# Patient Record
Sex: Female | Born: 1990 | Race: Black or African American | Hispanic: No | Marital: Single | Smoking: Never smoker
Health system: Southern US, Community
[De-identification: ages and names within clinical notes are randomized; demographics above are authoritative.]

## PROBLEM LIST (undated history)

## (undated) DIAGNOSIS — B9689 Other specified bacterial agents as the cause of diseases classified elsewhere: Secondary | ICD-10-CM

## (undated) DIAGNOSIS — N760 Acute vaginitis: Secondary | ICD-10-CM

---

## 2009-05-21 ENCOUNTER — Emergency Department (HOSPITAL_COMMUNITY): Admission: EM | Admit: 2009-05-21 | Discharge: 2009-05-21 | Payer: Self-pay | Admitting: Emergency Medicine

## 2011-03-02 ENCOUNTER — Emergency Department (HOSPITAL_COMMUNITY)
Admission: EM | Admit: 2011-03-02 | Discharge: 2011-03-02 | Disposition: A | Discharge status: Home / Self Care | Payer: No Typology Code available for payment source | Attending: Emergency Medicine | Admitting: Emergency Medicine

## 2011-03-02 ENCOUNTER — Emergency Department (HOSPITAL_COMMUNITY): Payer: No Typology Code available for payment source

## 2011-03-02 DIAGNOSIS — M25519 Pain in unspecified shoulder: Secondary | ICD-10-CM | POA: Insufficient documentation

## 2011-03-02 DIAGNOSIS — T1490XA Injury, unspecified, initial encounter: Secondary | ICD-10-CM | POA: Insufficient documentation

## 2011-03-02 DIAGNOSIS — M542 Cervicalgia: Secondary | ICD-10-CM | POA: Insufficient documentation

## 2011-03-02 DIAGNOSIS — Y92410 Unspecified street and highway as the place of occurrence of the external cause: Secondary | ICD-10-CM | POA: Insufficient documentation

## 2011-11-26 DIAGNOSIS — N760 Acute vaginitis: Secondary | ICD-10-CM | POA: Diagnosis not present

## 2011-11-26 DIAGNOSIS — Z01419 Encounter for gynecological examination (general) (routine) without abnormal findings: Secondary | ICD-10-CM | POA: Diagnosis not present

## 2011-11-27 DIAGNOSIS — Z30431 Encounter for routine checking of intrauterine contraceptive device: Secondary | ICD-10-CM | POA: Diagnosis not present

## 2011-12-31 ENCOUNTER — Emergency Department (INDEPENDENT_AMBULATORY_CARE_PROVIDER_SITE_OTHER)
Admission: EM | Admit: 2011-12-31 | Discharge: 2011-12-31 | Disposition: A | Discharge status: Home / Self Care | Payer: PRIVATE HEALTH INSURANCE | Source: Home / Self Care

## 2011-12-31 ENCOUNTER — Encounter (HOSPITAL_COMMUNITY): Payer: Self-pay

## 2011-12-31 DIAGNOSIS — J020 Streptococcal pharyngitis: Secondary | ICD-10-CM

## 2011-12-31 LAB — POCT RAPID STREP A: Streptococcus, Group A Screen (Direct): POSITIVE — AB

## 2011-12-31 MED ORDER — AMOXICILLIN 500 MG PO CAPS: 1000.0000 mg | ORAL_CAPSULE | Freq: Two times a day (BID) | ORAL | Status: AC

## 2011-12-31 NOTE — ED Notes (Signed)
C/o sore throat on rt side since yesterday.

## 2011-12-31 NOTE — ED Provider Notes (Signed)
History     CSN: [id number]  Arrival date & time 12/31/11  1106   First MD Initiated Contact with Patient 12/31/11 1214      Chief Complaint  Patient presents with  . Sore Throat    (Consider location/radiation/quality/duration/timing/severity/associated sxs/prior treatment) Patient is a 21 y.o. female presenting with pharyngitis.  Sore Throat This is a new problem. The current episode started 12 to 24 hours ago. The problem occurs constantly. The problem has been gradually worsening. Pertinent negatives include no chest pain, no abdominal pain, no headaches and no shortness of breath. The symptoms are aggravated by swallowing. Nothing relieves the symptoms. She has tried nothing for the symptoms. The treatment provided no relief.    History reviewed. No pertinent past medical history.  History reviewed. No pertinent past surgical history.  No family history on file.  History  Substance Use Topics  . Smoking status: Never Smoker   . Smokeless tobacco: Not on file  . Alcohol Use: Yes    OB History    Grav Para Term Preterm Abortions TAB SAB Ect Mult Living                  Review of Systems  Constitutional: Positive for fever. Negative for chills, activity change and fatigue.  HENT: Positive for ear pain, sore throat and trouble swallowing. Negative for hearing loss, rhinorrhea, sneezing, neck stiffness and postnasal drip.   Respiratory: Negative.  Negative for shortness of breath.   Cardiovascular: Negative for chest pain.  Gastrointestinal: Negative.  Negative for abdominal pain.  Genitourinary: Negative.   Musculoskeletal: Negative.   Neurological: Negative for headaches.    Allergies  Review of patient's allergies indicates no known allergies.  Home Medications  No current outpatient prescriptions on file.  BP 121/79  Pulse 70  Temp 99.4 F (37.4 C) (Oral)  Resp 16  SpO2 94%  LMP 12/10/2011  Physical Exam  Constitutional: She is oriented to person,  place, and time. She appears well-developed and well-nourished.  HENT:  Left Ear: External ear normal.  Eyes: Pupils are equal, round, and reactive to light.  Neck: Normal range of motion. Neck supple.  Cardiovascular: Normal rate, regular rhythm and normal heart sounds.   Pulmonary/Chest: Effort normal and breath sounds normal.  Musculoskeletal: Normal range of motion.  Neurological: She is alert and oriented to person, place, and time.  Skin: Skin is warm and dry.  Psychiatric: She has a normal mood and affect.    ED Course  Procedures (including critical care time)   Labs Reviewed  POCT RAPID STREP A ([facility] ONLY)   No results found.   No diagnosis found.    MDM  Rapid strep Positive        Hayden Rasmussen, NP 12/31/11 1234

## 2012-01-02 NOTE — ED Provider Notes (Signed)
Medical screening examination/treatment/procedure(s) were performed by resident physician or non-physician practitioner and as supervising physician I was immediately available for consultation/collaboration.   Wyndham Santilli DOUGLAS MD.    Hanan Moen D Clotee Schlicker, MD 01/02/12 2124 

## 2012-01-17 ENCOUNTER — Encounter (HOSPITAL_COMMUNITY): Payer: Self-pay | Admitting: Emergency Medicine

## 2012-01-17 ENCOUNTER — Emergency Department (INDEPENDENT_AMBULATORY_CARE_PROVIDER_SITE_OTHER)
Admission: EM | Admit: 2012-01-17 | Discharge: 2012-01-17 | Disposition: A | Discharge status: Home / Self Care | Payer: PRIVATE HEALTH INSURANCE | Source: Home / Self Care | Attending: Emergency Medicine | Admitting: Emergency Medicine

## 2012-01-17 DIAGNOSIS — N898 Other specified noninflammatory disorders of vagina: Secondary | ICD-10-CM

## 2012-01-17 DIAGNOSIS — N390 Urinary tract infection, site not specified: Secondary | ICD-10-CM

## 2012-01-17 LAB — POCT URINALYSIS DIP (DEVICE)
Bilirubin Urine: NEGATIVE
Nitrite: NEGATIVE
Protein, ur: 30 mg/dL — AB
pH: 6.5 (ref 5.0–8.0)

## 2012-01-17 LAB — RPR: RPR Ser Ql: NONREACTIVE

## 2012-01-17 LAB — WET PREP, GENITAL: Trich, Wet Prep: NONE SEEN

## 2012-01-17 LAB — HIV ANTIBODY (ROUTINE TESTING W REFLEX): HIV: NONREACTIVE

## 2012-01-17 MED ORDER — METRONIDAZOLE 500 MG PO TABS: 500.0000 mg | ORAL_TABLET | Freq: Two times a day (BID) | ORAL | Status: DC

## 2012-01-17 MED ORDER — FLUCONAZOLE 150 MG PO TABS: 150.0000 mg | ORAL_TABLET | Freq: Once | ORAL | Status: DC

## 2012-01-17 MED ORDER — ACYCLOVIR 400 MG PO TABS: 400.0000 mg | ORAL_TABLET | Freq: Three times a day (TID) | ORAL | Status: DC

## 2012-01-17 MED ORDER — CEPHALEXIN 500 MG PO CAPS: 500.0000 mg | ORAL_CAPSULE | Freq: Four times a day (QID) | ORAL | Status: DC

## 2012-01-17 NOTE — ED Notes (Signed)
Pt c/o vaginal discharge with itching x 1 wk. Was seen by school nurse was told that she has swollen lymph nodes and possible vaginal infection.

## 2012-01-17 NOTE — ED Provider Notes (Signed)
Medical screening examination/treatment/procedure(s) were performed by non-physician practitioner and as supervising physician I was immediately available for consultation/collaboration.  Leslee Home, M.D.   Reuben Likes, MD 01/17/12 2230

## 2012-01-17 NOTE — ED Provider Notes (Signed)
History     CSN: [id number]  Arrival date & time 01/17/12  9629   First MD Initiated Contact with Patient 01/17/12 [phone]      Chief Complaint  Patient presents with  . Vaginal Itching    vaginal irritation with discharge    (Consider location/radiation/quality/duration/timing/severity/associated sxs/prior treatment) Patient is a 21 y.o. female presenting with vaginal itching. The history is provided by the patient.  Vaginal Itching   21 y.o. female complains of nonodorous white thin non irritaiting vaginal discharge for 3 days ago. Denies abnormal vaginal bleeding, significant pelvic pain or fever. No UTI symptoms. Sexually active, sometimes condoms, no change in partner.  Last unprotected intercourse 4 days ago.  Denies history of known exposure to STD or symptoms in partner.  Patient's last menstrual period was 12/10/2011.  No history of STD's.  BCM-Implanon.  Last pap August 2013-NIL.  History reviewed. No pertinent past medical history.  History reviewed. No pertinent past surgical history.  History reviewed. No pertinent family history.  History  Substance Use Topics  . Smoking status: Never Smoker   . Smokeless tobacco: Not on file  . Alcohol Use: Yes    OB History    Grav Para Term Preterm Abortions TAB SAB Ect Mult Living                  Review of Systems  Constitutional: Negative.   Respiratory: Negative.   Cardiovascular: Negative.   Gastrointestinal: Negative.   Genitourinary: Positive for vaginal discharge. Negative for dysuria, urgency, flank pain, decreased urine volume, vaginal bleeding, difficulty urinating, menstrual problem and pelvic pain.    Allergies  Review of patient's allergies indicates no known allergies.  Home Medications   Current Outpatient Rx  Name Route Sig Dispense Refill  . ACYCLOVIR 400 MG PO TABS Oral Take 1 tablet (400 mg total) by mouth 3 (three) times daily. For five days, then twice daily. 50 tablet 6  . CEPHALEXIN 500  MG PO CAPS Oral Take 1 capsule (500 mg total) by mouth 4 (four) times daily. 20 capsule 0  . FLUCONAZOLE 150 MG PO TABS Oral Take 1 tablet (150 mg total) by mouth once. May repeat in 4 days if needed. 2 tablet 0  . METRONIDAZOLE 500 MG PO TABS Oral Take 1 tablet (500 mg total) by mouth 2 (two) times daily. 14 tablet 0    BP 106/64  Pulse 81  Temp 99 F (37.2 C) (Oral)  Resp 16  SpO2 99%  LMP 12/10/2011  Physical Exam  Nursing note and vitals reviewed. Constitutional: She is oriented to person, place, and time. Vital signs are normal. She appears well-developed and well-nourished. She is active and cooperative.  HENT:  Head: Normocephalic.  Mouth/Throat: Uvula is midline, oropharynx is clear and moist and mucous membranes are normal.  Eyes: Conjunctivae normal are normal. Pupils are equal, round, and reactive to light. No scleral icterus.  Neck: Trachea normal. Neck supple.  Cardiovascular: Normal rate, regular rhythm, normal heart sounds and normal pulses.   Pulmonary/Chest: Effort normal and breath sounds normal.  Abdominal: Soft. Normal appearance and bowel sounds are normal. There is tenderness in the suprapubic area. There is no CVA tenderness.  Genitourinary: Uterus normal. Pelvic exam was performed with patient supine. No labial fusion. There is lesion on the right labia. There is no rash, tenderness or injury on the right labia. There is lesion on the left labia. There is no rash, tenderness or injury on the left labia. Cervix exhibits  discharge. Cervix exhibits no motion tenderness and no friability. Right adnexum displays no mass, no tenderness and no fullness. Left adnexum displays no mass, no tenderness and no fullness. No erythema, tenderness or bleeding around the vagina. No foreign body around the vagina. No signs of injury around the vagina. Vaginal discharge found.  Lymphadenopathy:       Head (right side): No submental, no submandibular, no tonsillar, no preauricular, no  posterior auricular and no occipital adenopathy present.       Head (left side): No submental, no submandibular, no tonsillar, no preauricular, no posterior auricular and no occipital adenopathy present.    She has no cervical adenopathy.    She has no axillary adenopathy.       Right: Inguinal adenopathy present.       Left: Inguinal adenopathy present.  Neurological: She is alert and oriented to person, place, and time. No cranial nerve deficit or sensory deficit.  Skin: Skin is warm and dry. No rash noted.  Psychiatric: She has a normal mood and affect. Her speech is normal and behavior is normal. Judgment and thought content normal. Cognition and memory are normal.    ED Course  Procedures (including critical care time)  Labs Reviewed  POCT URINALYSIS DIP (DEVICE) - Abnormal; Notable for the following:    Hgb urine dipstick TRACE (*)     Protein, ur 30 (*)     Leukocytes, UA SMALL (*)  Biochemical Testing Only. Please order routine urinalysis from main lab if confirmatory testing is needed.   All other components within normal limits  WET PREP, GENITAL - Abnormal; Notable for the following:    Clue Cells Wet Prep HPF POC MODERATE (*)     WBC, Wet Prep HPF POC MODERATE (*)     All other components within normal limits  POCT PREGNANCY, URINE  GC/CHLAMYDIA PROBE AMP, GENITAL  RPR  HIV ANTIBODY (ROUTINE TESTING)  HSV 1 ANTIBODY, IGG  HSV 2 ANTIBODY, IGG  HERPES SIMPLEX VIRUS CULTURE   No results found.   1. UTI (lower urinary tract infection)   2. Vaginal Discharge   3. Vaginal lesion       MDM  H&P most c/w UTI and BV. Sent off GC/chlamydia, wet prep, HIV, RPR, HSV. Await results.  Antibiotics for UTI and BV. Advised pt to refrain from sexual contact until she he knows lab results, symptoms resolve, and partner(s) are treated. Pt provided working phone number. Pt agrees.          Johnsie Kindred, NP 01/17/12 1103

## 2012-01-18 ENCOUNTER — Telehealth (HOSPITAL_COMMUNITY): Payer: Self-pay | Admitting: *Deleted

## 2012-01-18 LAB — HSV 2 ANTIBODY, IGG: HSV 2 Glycoprotein G Ab, IgG: 0.47 IV

## 2012-01-18 NOTE — ED Notes (Signed)
GC/Chlamydia neg., Wet prep: mod. Clue cells, mod. WBC's, HIV/RPR non-reactive, HSV 1 2.11 (H) pos., HSV 2 0.42 neg., Herpes culture pending. I called pt.  Pt. verified x 2 and given results except herpes culture. Pt. told she would be called next week if it is positive. Pt. told she was adequately treated with Flagyl for bacterial vaginosis.  Pt. instructed to notify her partner. You can pass the virus even when you don't have an outbreak, so always practice safe sex. Get treated for each outbreak with Acyclovir or Valtrex. You may want to get an OB-GYN doctor who can call in a prescription for you when you have an outbreak.  Pt. asked for something for painful sores. I told her I would call back. Vassie Moselle 01/18/2012

## 2012-01-18 NOTE — ED Notes (Signed)
Discussed with Dr. Lorenz Coaster and he ordered Lidocaine gel 4 % apply every 4 hrs 15 gm.  I called pt. back and she said to call it to [facility] on [address]. I called the pharmacist @ [phone] and he only has Lidocaine 5% ointment in a 35 gm tube. Dr. Lorenz Coaster said that was OK. Rx. given to pharmacist. Vassie Moselle 01/18/2012

## 2012-01-21 LAB — HERPES SIMPLEX VIRUS CULTURE

## 2012-01-22 ENCOUNTER — Telehealth (HOSPITAL_COMMUNITY): Payer: Self-pay | Admitting: *Deleted

## 2012-01-22 NOTE — ED Notes (Signed)
Pt called and verified X 2.  Made aware of positive HSV-1 cervical culture.  Instructions previously provided by Cherly Anderson, RN.  Pt advised to establish PCP, number given to [facility].

## 2012-03-28 ENCOUNTER — Other Ambulatory Visit (HOSPITAL_COMMUNITY)
Admission: RE | Admit: 2012-03-28 | Discharge: 2012-03-28 | Disposition: A | Discharge status: Home / Self Care | Payer: PRIVATE HEALTH INSURANCE | Source: Ambulatory Visit | Attending: Family Medicine | Admitting: Family Medicine

## 2012-03-28 ENCOUNTER — Encounter (HOSPITAL_COMMUNITY): Payer: Self-pay | Admitting: Emergency Medicine

## 2012-03-28 ENCOUNTER — Emergency Department (INDEPENDENT_AMBULATORY_CARE_PROVIDER_SITE_OTHER)
Admission: EM | Admit: 2012-03-28 | Discharge: 2012-03-28 | Disposition: A | Discharge status: Home / Self Care | Payer: PRIVATE HEALTH INSURANCE | Source: Home / Self Care | Attending: Family Medicine | Admitting: Family Medicine

## 2012-03-28 DIAGNOSIS — N644 Mastodynia: Secondary | ICD-10-CM

## 2012-03-28 DIAGNOSIS — N760 Acute vaginitis: Secondary | ICD-10-CM | POA: Insufficient documentation

## 2012-03-28 DIAGNOSIS — N898 Other specified noninflammatory disorders of vagina: Secondary | ICD-10-CM

## 2012-03-28 LAB — HIV ANTIBODY (ROUTINE TESTING W REFLEX): HIV: NONREACTIVE

## 2012-03-28 MED ORDER — FLUCONAZOLE 150 MG PO TABS: 150.0000 mg | ORAL_TABLET | Freq: Once | ORAL | Status: DC

## 2012-03-28 MED ORDER — METRONIDAZOLE 500 MG PO TABS: 500.0000 mg | ORAL_TABLET | Freq: Two times a day (BID) | ORAL | Status: DC

## 2012-03-28 NOTE — ED Notes (Signed)
Pt c/o abd pain x2 weeks... Denies: fevers, vomiting, nauseas, diarrhea, urinary prob, vaginal discharge.... Normal bowel movements... Also c/o bilateral breast pain on the sides x2 weeks... Denies: any lumps/mass... Pt is alert w/no signs of distress.

## 2012-03-28 NOTE — ED Provider Notes (Signed)
History     CSN: [id number]  Arrival date & time 03/28/12  0901   None     Chief Complaint  Patient presents with  . Abdominal Pain    (Consider location/radiation/quality/duration/timing/severity/associated sxs/prior treatment) Patient is a 21 y.o. female presenting with vaginal discharge. The history is provided by the patient.  Vaginal Discharge This is a new problem. The current episode started more than 2 days ago. The problem occurs daily. The problem has not changed since onset.Associated symptoms include chest pain.  21 y.o. female complains of white itchy vaginal discharge for 1 week.  Denies abnormal vaginal bleeding, significant pelvic pain or fever. No UTI symptoms. Sexually active, does not use condoms, no change in partner.  Last unprotected intercourse 3 months ago.  Denies history of known exposure to STD or symptoms in partner.  No LMP recorded. Patient has had an implant.  Known history of genital herpes.  Pt additionally complains of bilateral breast pain for 1 week, tender to palpation.    Past Medical History  Diagnosis Date  . Hepatitis C     History reviewed. No pertinent past surgical history.  No family history on file.  History  Substance Use Topics  . Smoking status: Never Smoker   . Smokeless tobacco: Not on file  . Alcohol Use: Yes    OB History    Grav Para Term Preterm Abortions TAB SAB Ect Mult Living                  Review of Systems  Cardiovascular: Positive for chest pain.  Genitourinary: Positive for vaginal discharge.  All other systems reviewed and are negative.    Allergies  Review of patient's allergies indicates no known allergies.  Home Medications   Current Outpatient Rx  Name  Route  Sig  Dispense  Refill  . ACYCLOVIR 400 MG PO TABS   Oral   Take 1 tablet (400 mg total) by mouth 3 (three) times daily. For five days, then twice daily.   50 tablet   6   . FLUCONAZOLE 150 MG PO TABS   Oral   Take 1 tablet  (150 mg total) by mouth once. May repeat in 4 days if needed.   2 tablet   0   . METRONIDAZOLE 500 MG PO TABS   Oral   Take 1 tablet (500 mg total) by mouth 2 (two) times daily.   14 tablet   0     BP 108/69  Pulse 82  Temp 98.6 F (37 C) (Oral)  Resp 20  SpO2 100%  Physical Exam  Nursing note and vitals reviewed. Constitutional: She is oriented to person, place, and time. Vital signs are normal. She appears well-developed and well-nourished. She is active and cooperative.  HENT:  Head: Normocephalic.  Eyes: Conjunctivae normal are normal. Pupils are equal, round, and reactive to light. No scleral icterus.  Neck: Trachea normal and normal range of motion. Neck supple.  Cardiovascular: Normal rate, regular rhythm, normal heart sounds and intact distal pulses.   Pulmonary/Chest: Effort normal and breath sounds normal. Right breast exhibits tenderness. Right breast exhibits no inverted nipple, no mass, no nipple discharge and no skin change. Left breast exhibits tenderness. Left breast exhibits no inverted nipple, no mass, no nipple discharge and no skin change. Breasts are symmetrical.  Abdominal: Soft. Normal appearance. There is no tenderness. There is no CVA tenderness.  Genitourinary: Pelvic exam was performed with patient supine. No labial fusion. There is  lesion on the right labia. There is no rash, tenderness or injury on the right labia. There is no rash, tenderness, lesion or injury on the left labia. No erythema, tenderness or bleeding around the vagina. No foreign body around the vagina. No signs of injury around the vagina. Vaginal discharge found.  Lymphadenopathy:    She has no cervical adenopathy.       Right: No inguinal adenopathy present.       Left: No inguinal adenopathy present.  Neurological: She is alert and oriented to person, place, and time. No cranial nerve deficit or sensory deficit.  Skin: Skin is warm and dry.  Psychiatric: She has a normal mood and  affect. Her speech is normal and behavior is normal. Judgment and thought content normal. Cognition and memory are normal.    ED Course  Procedures (including critical care time)   Labs Reviewed  CERVICOVAGINAL ANCILLARY ONLY  HIV ANTIBODY (ROUTINE TESTING)   No results found.   1. Vaginal discharge   2. Breast pain       MDM  Await cultures and HIV results, begin medications as prescribed.  [facility].  Breast tenderness more than likely related to hormonal changes-no mass palpated.        Johnsie Kindred, NP 03/28/12 [phone]

## 2012-03-31 NOTE — ED Notes (Signed)
HIV antibody final report : negative ; cervical repotr pending

## 2012-03-31 NOTE — ED Provider Notes (Signed)
Medical screening examination/treatment/procedure(s) were performed by resident physician or non-physician practitioner and as supervising physician I was immediately available for consultation/collaboration.   Barkley Bruns MD.    Linna Hoff, MD 03/31/12 1723

## 2012-04-01 NOTE — ED Notes (Signed)
Candida positive, Gardnerella neg., trich neg.  Pt. Adequately treated with Diflucan. Vassie Moselle 04/01/2012

## 2012-05-21 ENCOUNTER — Encounter (HOSPITAL_COMMUNITY): Payer: Self-pay | Admitting: *Deleted

## 2012-05-21 ENCOUNTER — Emergency Department (INDEPENDENT_AMBULATORY_CARE_PROVIDER_SITE_OTHER)
Admission: EM | Admit: 2012-05-21 | Discharge: 2012-05-21 | Disposition: A | Discharge status: Home / Self Care | Payer: PRIVATE HEALTH INSURANCE | Source: Home / Self Care | Attending: Family Medicine | Admitting: Family Medicine

## 2012-05-21 DIAGNOSIS — N644 Mastodynia: Secondary | ICD-10-CM

## 2012-05-21 DIAGNOSIS — N898 Other specified noninflammatory disorders of vagina: Secondary | ICD-10-CM

## 2012-05-21 DIAGNOSIS — N390 Urinary tract infection, site not specified: Secondary | ICD-10-CM

## 2012-05-21 LAB — POCT URINALYSIS DIP (DEVICE)
Bilirubin Urine: NEGATIVE
Ketones, ur: NEGATIVE mg/dL
Leukocytes, UA: NEGATIVE
Specific Gravity, Urine: >=1.03 (ref 1.005–1.030)

## 2012-05-21 LAB — POCT PREGNANCY, URINE: Preg Test, Ur: NEGATIVE

## 2012-05-21 MED ORDER — CIPROFLOXACIN HCL 500 MG PO TABS: 500.0000 mg | ORAL_TABLET | Freq: Two times a day (BID) | ORAL | Status: DC

## 2012-05-21 NOTE — ED Notes (Addendum)
C/o low abdominal pain onset 2 weeks ago and low back pain onset 2 days ago.  Pain in back is sharp.  Voiding frequently in small amounts.  No burning with urination.  Urine is yellow despite drinking a lot of water. Urine sometimes has an odor.

## 2012-05-21 NOTE — ED Provider Notes (Signed)
History     CSN: [id number]  Arrival date & time 05/21/12  1918   First MD Initiated Contact with Patient 05/21/12 1930      Chief Complaint  Patient presents with  . Urinary Tract Infection    (Consider location/radiation/quality/duration/timing/severity/associated sxs/prior treatment) Patient is a 22 y.o. female presenting with urinary tract infection. The history is provided by the patient.  Urinary Tract Infection This is a new problem. The current episode started more than 2 days ago. The problem has been gradually worsening. Pertinent negatives include no abdominal pain.    Past Medical History  Diagnosis Date  . Hepatitis C     History reviewed. No pertinent past surgical history.  History reviewed. No pertinent family history.  History  Substance Use Topics  . Smoking status: Never Smoker   . Smokeless tobacco: Not on file  . Alcohol Use: Yes     Comment: occasional    OB History    Grav Para Term Preterm Abortions TAB SAB Ect Mult Living                  Review of Systems  Constitutional: Negative.   Gastrointestinal: Negative.  Negative for abdominal pain.  Genitourinary: Positive for urgency and frequency. Negative for hematuria, vaginal bleeding, vaginal discharge, vaginal pain and menstrual problem.    Allergies  Review of patient's allergies indicates no known allergies.  Home Medications   Current Outpatient Rx  Name  Route  Sig  Dispense  Refill  . ACYCLOVIR 400 MG PO TABS   Oral   Take 1 tablet (400 mg total) by mouth 3 (three) times daily. For five days, then twice daily.   50 tablet   6   . CIPROFLOXACIN HCL 500 MG PO TABS   Oral   Take 1 tablet (500 mg total) by mouth every 12 (twelve) hours.   10 tablet   0   . FLUCONAZOLE 150 MG PO TABS   Oral   Take 1 tablet (150 mg total) by mouth once. May repeat in 4 days if needed.   2 tablet   0   . METRONIDAZOLE 500 MG PO TABS   Oral   Take 1 tablet (500 mg total) by mouth 2  (two) times daily.   14 tablet   0     BP 106/65  Pulse 66  Temp 98.5 F (36.9 C) (Oral)  Resp 16  Ht 5\' 4"  (1.626 m)  Wt 125 lb (56.7 kg)  BMI 21.46 kg/m2  SpO2 100%  Physical Exam  Nursing note and vitals reviewed. Constitutional: She is oriented to person, place, and time. She appears well-developed and well-nourished.  Abdominal: Soft. Bowel sounds are normal. She exhibits no mass. There is no tenderness. There is no rebound and no guarding.  Neurological: She is alert and oriented to person, place, and time.  Skin: Skin is warm and dry.    ED Course  Procedures (including critical care time)   Labs Reviewed  POCT URINALYSIS DIP (DEVICE)  POCT PREGNANCY, URINE   No results found.   1. UTI (lower urinary tract infection)       MDM          Linna Hoff, MD 05/21/12 [phone]

## 2012-07-21 ENCOUNTER — Encounter (HOSPITAL_COMMUNITY): Payer: Self-pay | Admitting: Emergency Medicine

## 2012-07-21 ENCOUNTER — Other Ambulatory Visit (HOSPITAL_COMMUNITY)
Admission: RE | Admit: 2012-07-21 | Discharge: 2012-07-21 | Disposition: A | Discharge status: Home / Self Care | Payer: PRIVATE HEALTH INSURANCE | Source: Ambulatory Visit | Attending: Emergency Medicine | Admitting: Emergency Medicine

## 2012-07-21 ENCOUNTER — Emergency Department (INDEPENDENT_AMBULATORY_CARE_PROVIDER_SITE_OTHER)
Admission: EM | Admit: 2012-07-21 | Discharge: 2012-07-21 | Disposition: A | Discharge status: Home / Self Care | Payer: PRIVATE HEALTH INSURANCE | Source: Home / Self Care | Attending: Emergency Medicine | Admitting: Emergency Medicine

## 2012-07-21 DIAGNOSIS — R3989 Other symptoms and signs involving the genitourinary system: Secondary | ICD-10-CM

## 2012-07-21 DIAGNOSIS — Z113 Encounter for screening for infections with a predominantly sexual mode of transmission: Secondary | ICD-10-CM | POA: Insufficient documentation

## 2012-07-21 DIAGNOSIS — R399 Unspecified symptoms and signs involving the genitourinary system: Secondary | ICD-10-CM

## 2012-07-21 DIAGNOSIS — N760 Acute vaginitis: Secondary | ICD-10-CM | POA: Insufficient documentation

## 2012-07-21 LAB — POCT I-STAT, CHEM 8
BUN: 6 mg/dL (ref 6–23)
Chloride: 103 mEq/L (ref 96–112)
Creatinine, Ser: 0.8 mg/dL (ref 0.50–1.10)
Glucose, Bld: 96 mg/dL (ref 70–99)
Hemoglobin: 16 g/dL — ABNORMAL HIGH (ref 12.0–15.0)
Potassium: 4 mEq/L (ref 3.5–5.1)
Sodium: 140 mEq/L (ref 135–145)

## 2012-07-21 LAB — POCT URINALYSIS DIP (DEVICE)
Glucose, UA: NEGATIVE mg/dL
Hgb urine dipstick: NEGATIVE
Nitrite: NEGATIVE
Protein, ur: NEGATIVE mg/dL
Urobilinogen, UA: 0.2 mg/dL (ref 0.0–1.0)

## 2012-07-21 LAB — CBC WITH DIFFERENTIAL/PLATELET
Basophils Absolute: 0.1 10*3/uL (ref 0.0–0.1)
Basophils Relative: 1 % (ref 0–1)
Eosinophils Relative: 4 % (ref 0–5)
Lymphocytes Relative: 23 % (ref 12–46)
MCHC: 34.8 g/dL (ref 30.0–36.0)
MCV: 88.7 fL (ref 78.0–100.0)
Platelets: 321 10*3/uL (ref 150–400)
RDW: 12.7 % (ref 11.5–15.5)
WBC: 8.2 10*3/uL (ref 4.0–10.5)

## 2012-07-21 LAB — POCT PREGNANCY, URINE: Preg Test, Ur: NEGATIVE

## 2012-07-21 NOTE — ED Notes (Signed)
Pt c/o Abdominal pain and back pain x 2 days. Possible UTI sx. Urinary frequency and burning. No discharge. No fever, n/v, or diarrhea. LMP 2 days ago. Patient is alert and oriented.

## 2012-07-21 NOTE — ED Provider Notes (Signed)
History     CSN: [id number]  Arrival date & time 07/21/12  1020   First MD Initiated Contact with Patient 07/21/12 1021      Chief Complaint  Patient presents with  . Abdominal Pain  . Back Pain    (Consider location/radiation/quality/duration/timing/severity/associated sxs/prior treatment) HPI Comments: Patient presents urgent care describing that for a few weeks intermittently she's been having recurrent lower back pains associated with the sensation that when she finished urinating " feels like I want to keep urinating but I can't move", it's not all the time but I feel that once in a while. Patient denies any vaginal discharge bleeding rashes and expresses no concerns as having been exposed to a sexual transmitted illness. Patient denies any pelvic pain fevers or flank pain nausea or vomiting. She has not tried anything for her lower back pain  Patient is a 22 y.o. female presenting with abdominal pain and back pain. The history is provided by the patient.  Abdominal Pain Pain location:  Suprapubic Pain quality: aching and dull   Pain radiates to:  Back Pain severity:  Mild Onset quality:  Gradual Timing:  Intermittent Chronicity:  Recurrent Context: not recent sexual activity   Relieved by:  Nothing Associated symptoms: no anorexia, no chills, no diarrhea, no dysuria, no fever, no hematuria, no nausea, no shortness of breath, no vaginal bleeding, no vaginal discharge and no vomiting   Risk factors: not pregnant   Back Pain Associated symptoms: abdominal pain   Associated symptoms: no dysuria, no fever and no pelvic pain     Past Medical History  Diagnosis Date  . Hepatitis C     History reviewed. No pertinent past surgical history.  History reviewed. No pertinent family history.  History  Substance Use Topics  . Smoking status: Never Smoker   . Smokeless tobacco: Not on file  . Alcohol Use: Yes     Comment: occasional    OB History   Grav Para Term Preterm  Abortions TAB SAB Ect Mult Living                  Review of Systems  Constitutional: Negative for fever, chills, activity change and appetite change.  HENT: Negative for neck pain and neck stiffness.   Respiratory: Negative for shortness of breath.   Gastrointestinal: Positive for abdominal pain. Negative for nausea, vomiting, diarrhea and anorexia.  Genitourinary: Positive for difficulty urinating. Negative for dysuria, urgency, frequency, hematuria, flank pain, decreased urine volume, vaginal bleeding, vaginal discharge, enuresis, genital sores, menstrual problem, pelvic pain and dyspareunia.  Musculoskeletal: Positive for back pain. Negative for joint swelling, arthralgias and gait problem.  Skin: Negative for rash.    Allergies  Review of patient's allergies indicates no known allergies.  Home Medications   Current Outpatient Rx  Name  Route  Sig  Dispense  Refill  . fish oil-omega-3 fatty acids 1000 MG capsule   Oral   Take 2 g by mouth daily.         Marland Kitchen acyclovir (ZOVIRAX) 400 MG tablet   Oral   Take 1 tablet (400 mg total) by mouth 3 (three) times daily. For five days, then twice daily.   50 tablet   6   . ciprofloxacin (CIPRO) 500 MG tablet   Oral   Take 1 tablet (500 mg total) by mouth every 12 (twelve) hours.   10 tablet   0   . fluconazole (DIFLUCAN) 150 MG tablet   Oral   Take  1 tablet (150 mg total) by mouth once. May repeat in 4 days if needed.   2 tablet   0   . metroNIDAZOLE (FLAGYL) 500 MG tablet   Oral   Take 1 tablet (500 mg total) by mouth 2 (two) times daily.   14 tablet   0     BP 113/80  Pulse 60  Temp(Src) 99.2 F (37.3 C) (Oral)  SpO2 100%  LMP 07/19/2012  Physical Exam  Nursing note and vitals reviewed. Constitutional: She appears well-developed and well-nourished.  Cardiovascular: Normal rate.  Exam reveals no gallop and no friction rub.   No murmur heard. Abdominal: Soft. She exhibits no distension and no mass. There is no  tenderness. There is no rigidity, no rebound, no guarding, no CVA tenderness, no tenderness at McBurney's point and negative Murphy's sign.  Neurological: She is alert.  Skin: No erythema.    ED Course  Procedures (including critical care time)  Labs Reviewed  POCT I-STAT, CHEM 8 - Abnormal; Notable for the following:    Calcium, Ion 1.29 (*)    Hemoglobin 16.0 (*)    HCT 47.0 (*)    All other components within normal limits  URINE CULTURE  CBC WITH DIFFERENTIAL  POCT URINALYSIS DIP (DEVICE)  POCT PREGNANCY, URINE  URINE CYTOLOGY ANCILLARY ONLY   No results found.   1. Lower urinary tract symptoms       MDM  Nonspecific- recurrent in term attempt urinary symptoms. Abdominal exam was unremarkable urine today was are markable absent sample for cultures. Have encouraged patient to followup with the urologist her primary care Dr. for symptoms persist. We have also send sample for a STD screening.        Jimmie Molly, MD 07/21/12 [phone]

## 2012-07-21 NOTE — ED Notes (Signed)
Patient not ready for discharge.  Having labs and urine specimens collected.

## 2012-07-22 LAB — URINE CULTURE: Colony Count: NO GROWTH

## 2012-08-29 ENCOUNTER — Other Ambulatory Visit (HOSPITAL_COMMUNITY)
Admission: RE | Admit: 2012-08-29 | Discharge: 2012-08-29 | Disposition: A | Discharge status: Home / Self Care | Payer: Medicaid - Out of State | Source: Ambulatory Visit | Attending: Emergency Medicine | Admitting: Emergency Medicine

## 2012-08-29 ENCOUNTER — Encounter (HOSPITAL_COMMUNITY): Payer: Self-pay | Admitting: Emergency Medicine

## 2012-08-29 ENCOUNTER — Emergency Department (INDEPENDENT_AMBULATORY_CARE_PROVIDER_SITE_OTHER)
Admission: EM | Admit: 2012-08-29 | Discharge: 2012-08-29 | Disposition: A | Discharge status: Home / Self Care | Payer: PRIVATE HEALTH INSURANCE | Source: Home / Self Care | Attending: Emergency Medicine | Admitting: Emergency Medicine

## 2012-08-29 DIAGNOSIS — A499 Bacterial infection, unspecified: Secondary | ICD-10-CM

## 2012-08-29 DIAGNOSIS — Z113 Encounter for screening for infections with a predominantly sexual mode of transmission: Secondary | ICD-10-CM | POA: Insufficient documentation

## 2012-08-29 DIAGNOSIS — R3989 Other symptoms and signs involving the genitourinary system: Secondary | ICD-10-CM

## 2012-08-29 DIAGNOSIS — N760 Acute vaginitis: Secondary | ICD-10-CM

## 2012-08-29 LAB — POCT URINALYSIS DIP (DEVICE)
Bilirubin Urine: NEGATIVE
Hgb urine dipstick: NEGATIVE
Ketones, ur: NEGATIVE mg/dL
Protein, ur: NEGATIVE mg/dL
Specific Gravity, Urine: >=1.03 (ref 1.005–1.030)
pH: 5.5 (ref 5.0–8.0)

## 2012-08-29 MED ORDER — METRONIDAZOLE 500 MG PO TABS: 500.0000 mg | ORAL_TABLET | Freq: Two times a day (BID) | ORAL | Status: DC

## 2012-08-29 NOTE — ED Notes (Signed)
Chart review.

## 2012-08-29 NOTE — ED Provider Notes (Addendum)
Chief Complaint:   Chief Complaint  Patient presents with  . Vaginal Discharge    History of Present Illness:   Jennifer Strong is a 22 year old female who has had a one-week history of vaginal discharge and older. She has a history of yeast infections in the past. She has an Implanad and, but has had spotting bleeding for the past one month. She denies any vaginal itching, pain in the vulva, vagina, or pelvic area, fever, chills, nausea, vomiting, or urinary symptoms.  Review of Systems:  Other than noted above, the patient denies any of the following symptoms: Systemic:  No fever, chills, sweats, or weight loss. GI:  No abdominal pain, nausea, anorexia, vomiting, diarrhea, constipation, melena or hematochezia. GU:  No dysuria, frequency, urgency, hematuria, vaginal discharge, itching, or abnormal vaginal bleeding. Skin:  No rash or itching.  PMFSH:  Past medical history, family history, social history, meds, and allergies were reviewed.    Physical Exam:   Vital signs:  BP 120/74  Pulse 92  Temp(Src) 98.5 F (36.9 C) (Oral)  Resp 18  SpO2 100%  LMP 08/29/2012 General:  Alert, oriented and in no distress. Lungs:  Breath sounds clear and equal bilaterally.  No wheezes, rales or rhonchi. Heart:  Regular rhythm.  No gallops or murmers. Abdomen:  Soft, flat and non-distended.  No organomegaly or mass.  No tenderness, guarding or rebound.  Bowel sounds normally active. Pelvic exam:  Normal external genitalia, vaginal and cervical mucosa were normal. There was no discharge and only minimal odor. There was a small amount of bleeding coming from the cervical os. There was no pain on movement of the cervical os. The uterus was normal in size and shape and nontender. She has mild adnexal tenderness bilaterally. There were no masses. Skin:  Clear, warm and dry.  Labs:   Results for orders placed during the hospital encounter of 08/29/12  POCT URINALYSIS DIP (DEVICE)      Result Value  Range   Glucose, UA NEGATIVE  NEGATIVE mg/dL   Bilirubin Urine NEGATIVE  NEGATIVE   Ketones, ur NEGATIVE  NEGATIVE mg/dL   Specific Gravity, Urine >=1.030  1.005 - 1.030   Hgb urine dipstick NEGATIVE  NEGATIVE   pH 5.5  5.0 - 8.0   Protein, ur NEGATIVE  NEGATIVE mg/dL   Urobilinogen, UA 0.2  0.0 - 1.0 mg/dL   Nitrite NEGATIVE  NEGATIVE   Leukocytes, UA NEGATIVE  NEGATIVE  POCT PREGNANCY, URINE      Result Value Range   Preg Test, Ur NEGATIVE  NEGATIVE    Assessment:  The encounter diagnosis was Bacterial vaginosis.  Patient desires removal of Implanad. This may be contributing to her bacterial vaginosis.  Plan:   1.  The following meds were prescribed:   Discharge Medication List as of 08/29/2012  2:41 PM    START taking these medications   Details  !! metroNIDAZOLE (FLAGYL) 500 MG tablet Take 1 tablet (500 mg total) by mouth 2 (two) times daily., Starting 08/29/2012, Until Discontinued, Normal     !! - Potential duplicate medications found. Please discuss with provider.     2.  The patient was instructed in symptomatic care and handouts were given. 3.  The patient was told to return if becoming worse in any way, if no better in 3 or 4 days, and given some red flag symptoms such as worsening pelvic pain, fever, or vomiting that would indicate earlier return. 4.  Follow up with the family  practice at [facility] hospital clinics for removal of Implanad. She also has had chronic, irritative voiding symptoms. She was here a month and a half ago because of these symptoms. Her he urine culture came back negative. She was given antibiotics, but has not improved. I suggested followup with Dr. Alfredo Martinez.    Reuben Likes, MD 08/29/12 1610  Reuben Likes, MD 08/29/12 2206

## 2012-08-29 NOTE — ED Notes (Signed)
Pt c/o vag discharge onset 1 week Discharge is white w/an odor to it Denies: hematuria, dysuria, f/v/n/d, abd/back pain  She is alert and oriented w/no signs of acute distress.

## 2012-08-29 NOTE — ED Notes (Signed)
Call back number verified.  

## 2012-09-01 NOTE — ED Notes (Signed)
GC/Chlamydia neg., Affirm: Gardnerella pos., Candida and Trich neg.,  Pt. adequately treated with Flagyl. Annalyce Lanpher M 09/01/2012  

## 2012-09-14 ENCOUNTER — Emergency Department (INDEPENDENT_AMBULATORY_CARE_PROVIDER_SITE_OTHER)
Admission: EM | Admit: 2012-09-14 | Discharge: 2012-09-14 | Disposition: A | Discharge status: Home / Self Care | Payer: PRIVATE HEALTH INSURANCE | Source: Home / Self Care

## 2012-09-14 ENCOUNTER — Encounter (HOSPITAL_COMMUNITY): Payer: Self-pay | Admitting: *Deleted

## 2012-09-14 DIAGNOSIS — R3989 Other symptoms and signs involving the genitourinary system: Secondary | ICD-10-CM

## 2012-09-14 DIAGNOSIS — A499 Bacterial infection, unspecified: Secondary | ICD-10-CM

## 2012-09-14 DIAGNOSIS — N760 Acute vaginitis: Secondary | ICD-10-CM

## 2012-09-14 DIAGNOSIS — N9489 Other specified conditions associated with female genital organs and menstrual cycle: Secondary | ICD-10-CM

## 2012-09-14 LAB — POCT URINALYSIS DIP (DEVICE)
Bilirubin Urine: NEGATIVE
Ketones, ur: NEGATIVE mg/dL
Leukocytes, UA: NEGATIVE
Protein, ur: NEGATIVE mg/dL
Specific Gravity, Urine: 1.025 (ref 1.005–1.030)
pH: 6.5 (ref 5.0–8.0)

## 2012-09-14 NOTE — ED Provider Notes (Signed)
History     CSN: [id number]  Arrival date & time 09/14/12  1118   None     Chief Complaint  Patient presents with  . Abdominal Pain    (Consider location/radiation/quality/duration/timing/severity/associated sxs/prior treatment) Patient is a 22 y.o. female presenting with abdominal pain. The history is provided by the patient.  Abdominal Pain Pain location:  RLQ Pain quality: sharp   Pain radiates to:  Does not radiate Pain severity:  Mild Progression:  Worsening Chronicity:  Recurrent Context comment:  Seen 5/2 and eval and treated with flagyl without improvement of sx. continues with irreg menses on implanon for 2 mos and urinary freq. has not had f/u as advised. Associated symptoms: vaginal bleeding   Associated symptoms: no dysuria, no hematuria and no vaginal discharge     Past Medical History  Diagnosis Date  . Hepatitis C     History reviewed. No pertinent past surgical history.  No family history on file.  History  Substance Use Topics  . Smoking status: Never Smoker   . Smokeless tobacco: Not on file  . Alcohol Use: Yes     Comment: occasional    OB History   Grav Para Term Preterm Abortions TAB SAB Ect Mult Living                  Review of Systems  Constitutional: Negative.   Gastrointestinal: Positive for abdominal pain.  Genitourinary: Positive for frequency, vaginal bleeding and pelvic pain. Negative for dysuria, hematuria, vaginal discharge and difficulty urinating.    Allergies  Review of patient's allergies indicates no known allergies.  Home Medications   Current Outpatient Rx  Name  Route  Sig  Dispense  Refill  . acyclovir (ZOVIRAX) 400 MG tablet   Oral   Take 1 tablet (400 mg total) by mouth 3 (three) times daily. For five days, then twice daily.   50 tablet   6   . fish oil-omega-3 fatty acids 1000 MG capsule   Oral   Take 2 g by mouth daily.         . ciprofloxacin (CIPRO) 500 MG tablet   Oral   Take 1 tablet (500 mg  total) by mouth every 12 (twelve) hours.   10 tablet   0   . fluconazole (DIFLUCAN) 150 MG tablet   Oral   Take 1 tablet (150 mg total) by mouth once. May repeat in 4 days if needed.   2 tablet   0   . metroNIDAZOLE (FLAGYL) 500 MG tablet   Oral   Take 1 tablet (500 mg total) by mouth 2 (two) times daily.   14 tablet   0   . metroNIDAZOLE (FLAGYL) 500 MG tablet   Oral   Take 1 tablet (500 mg total) by mouth 2 (two) times daily.   14 tablet   0     BP 109/72  Pulse 64  Temp(Src) 98 F (36.7 C) (Oral)  Resp 16  SpO2 99%  LMP 08/29/2012  Physical Exam  Nursing note and vitals reviewed. Constitutional: She is oriented to person, place, and time. She appears well-developed and well-nourished.  HENT:  Head: Normocephalic.  Abdominal: Soft. Bowel sounds are normal. She exhibits no distension and no mass. There is tenderness in the right lower quadrant. There is no rebound, no guarding and no CVA tenderness.  Neurological: She is alert and oriented to person, place, and time.  Skin: Skin is warm and dry.    ED Course  Procedures (including critical care time)  Labs Reviewed  POCT URINALYSIS DIP (DEVICE) - Abnormal; Notable for the following:    Hgb urine dipstick LARGE (*)    All other components within normal limits  POCT PREGNANCY, URINE   No results found.   1. Pelvic pain syndrome       MDM          Linna Hoff, MD 09/14/12 1157

## 2012-09-14 NOTE — ED Notes (Signed)
Patient complains of abdominal pain in right lower quadrant that started 1 month ago. Denies fever/chills. States having constipation most of the time.

## 2012-09-16 ENCOUNTER — Encounter (HOSPITAL_COMMUNITY): Payer: Self-pay | Admitting: Emergency Medicine

## 2012-09-16 ENCOUNTER — Emergency Department (HOSPITAL_COMMUNITY)
Admission: EM | Admit: 2012-09-16 | Discharge: 2012-09-16 | Disposition: A | Discharge status: Home / Self Care | Payer: Medicaid - Out of State | Attending: Emergency Medicine | Admitting: Emergency Medicine

## 2012-09-16 DIAGNOSIS — S41009A Unspecified open wound of unspecified shoulder, initial encounter: Secondary | ICD-10-CM | POA: Insufficient documentation

## 2012-09-16 DIAGNOSIS — Y939 Activity, unspecified: Secondary | ICD-10-CM | POA: Insufficient documentation

## 2012-09-16 DIAGNOSIS — Y929 Unspecified place or not applicable: Secondary | ICD-10-CM | POA: Insufficient documentation

## 2012-09-16 DIAGNOSIS — IMO0002 Reserved for concepts with insufficient information to code with codable children: Secondary | ICD-10-CM

## 2012-09-16 DIAGNOSIS — Z79899 Other long term (current) drug therapy: Secondary | ICD-10-CM | POA: Insufficient documentation

## 2012-09-16 DIAGNOSIS — W268XXA Contact with other sharp object(s), not elsewhere classified, initial encounter: Secondary | ICD-10-CM | POA: Insufficient documentation

## 2012-09-16 NOTE — ED Notes (Signed)
Has a piercing on rt clavicle and she states it is stuck

## 2012-09-16 NOTE — ED Provider Notes (Signed)
History     CSN: [id number]  Arrival date & time 09/16/12  1307   First MD Initiated Contact with Patient 09/16/12 1317      Chief Complaint  Patient presents with  . Incisional Pain    (Consider location/radiation/quality/duration/timing/severity/associated sxs/prior treatment) HPI Comments: Patient presents today requesting to have a piercing removed.  She reports that she has had the piercing for the past year.  It is a barb piercing.  She has attempted to remove it herself, but was unable to.  She denies fever or chills.  Denies erythema, edema, induration, or drainage from the area.    The history is provided by the patient.    History reviewed. No pertinent past medical history.  No past surgical history on file.  No family history on file.  History  Substance Use Topics  . Smoking status: Never Smoker   . Smokeless tobacco: Not on file  . Alcohol Use: Yes     Comment: occasional    OB History   Grav Para Term Preterm Abortions TAB SAB Ect Mult Living                  Review of Systems  Constitutional: Negative for fever and chills.  All other systems reviewed and are negative.    Allergies  Review of patient's allergies indicates no known allergies.  Home Medications   Current Outpatient Rx  Name  Route  Sig  Dispense  Refill  . acyclovir (ZOVIRAX) 400 MG tablet   Oral   Take 400 mg by mouth See admin instructions. Take 1 tablet by mouth three times a day for 5 days then twice a day.         . etonogestrel (IMPLANON) 68 MG IMPL implant   Subcutaneous   Inject 1 each into the skin once.         . Multiple Vitamin (MULTIVITAMIN WITH MINERALS) TABS   Oral   Take 1 tablet by mouth daily.           BP 118/71  Pulse 90  Temp(Src) 98.7 F (37.1 C)  Resp 16  SpO2 100%  LMP 08/29/2012  Physical Exam  Nursing note and vitals reviewed. Constitutional: She appears well-developed and well-nourished.  HENT:  Head: Normocephalic and  atraumatic.  Neck: Normal range of motion. Neck supple.  Cardiovascular: Normal rate, regular rhythm and normal heart sounds.   Pulmonary/Chest: Effort normal and breath sounds normal.  Neurological: She is alert.  Skin: Skin is warm and dry.     Barb piercing just below the right clavicle.  No surrounding erythema, induration, or edema.  No drainage.    Psychiatric: She has a normal mood and affect.    ED Course  FOREIGN BODY REMOVAL Date/Time: 09/16/2012 3:35 PM Performed by: Anne Shutter, Tyreshia Ingman Authorized by: Anne Shutter, Herbert Seta Consent: Verbal consent obtained. Risks and benefits: risks, benefits and alternatives were discussed Consent given by: patient Patient understanding: patient states understanding of the procedure being performed Patient consent: the patient's understanding of the procedure matches consent given Anesthesia: local infiltration Local anesthetic: lidocaine 2% with epinephrine Patient sedated: no Complexity: simple 1 objects recovered. Objects recovered: piercing Post-procedure assessment: foreign body removed Patient tolerance: Patient tolerated the procedure well with no immediate complications.   (including critical care time)  Labs Reviewed - No data to display No results found.   No diagnosis found.    MDM  Patient presenting with request to have piercing removed.  Piercing removed  in the ED without complications.  No signs of infection at this time.        Pascal Lux [place], PA-C 09/16/12 1544

## 2012-09-17 NOTE — ED Provider Notes (Signed)
Medical screening examination/treatment/procedure(s) were performed by non-physician practitioner and as supervising physician I was immediately available for consultation/collaboration.   Manuela Halbur E Natia Fahmy, MD 09/17/12 1407 

## 2012-09-22 ENCOUNTER — Emergency Department (HOSPITAL_COMMUNITY)
Admission: EM | Admit: 2012-09-22 | Discharge: 2012-09-22 | Discharge status: Against Medical Advice | Payer: Medicaid - Out of State | Attending: Emergency Medicine | Admitting: Emergency Medicine

## 2012-09-22 ENCOUNTER — Encounter (HOSPITAL_COMMUNITY): Payer: Self-pay

## 2012-09-22 DIAGNOSIS — R1031 Right lower quadrant pain: Secondary | ICD-10-CM | POA: Insufficient documentation

## 2012-09-22 LAB — CBC WITH DIFFERENTIAL/PLATELET
Basophils Absolute: 0.1 10*3/uL (ref 0.0–0.1)
Basophils Relative: 1 % (ref 0–1)
Eosinophils Absolute: 0.3 10*3/uL (ref 0.0–0.7)
HCT: 39.6 % (ref 36.0–46.0)
Hemoglobin: 13.6 g/dL (ref 12.0–15.0)
MCH: 30.9 pg (ref 26.0–34.0)
MCHC: 34.3 g/dL (ref 30.0–36.0)
Monocytes Absolute: 0.7 10*3/uL (ref 0.1–1.0)
Monocytes Relative: 7 % (ref 3–12)
Neutro Abs: 6.6 10*3/uL (ref 1.7–7.7)
Neutrophils Relative %: 66 % (ref 43–77)
RDW: 12.8 % (ref 11.5–15.5)

## 2012-09-22 LAB — COMPREHENSIVE METABOLIC PANEL
AST: 39 U/L — ABNORMAL HIGH (ref 0–37)
Albumin: 3.9 g/dL (ref 3.5–5.2)
BUN: 12 mg/dL (ref 6–23)
Creatinine, Ser: 0.79 mg/dL (ref 0.50–1.10)
Total Protein: 8.2 g/dL (ref 6.0–8.3)

## 2012-09-22 LAB — POCT PREGNANCY, URINE: Preg Test, Ur: NEGATIVE

## 2012-09-22 NOTE — ED Notes (Signed)
Pt c/o intermittent RLQ pain, Right lower back pain, and increase urgency but unable to void starting mid March. Pt seen at [facility] and was referred to a kidney specialist and the [facility] hospital. Pt has an appt with a [facility] 11/07/2012 in [place]. Pt is here for school. Pt denies abnormal vaginal odor or d/c

## 2012-09-22 NOTE — ED Notes (Addendum)
Lab called and said need to recollect on urine because it dumped in the tubestation.  Reordered

## 2012-09-23 ENCOUNTER — Emergency Department (HOSPITAL_COMMUNITY): Payer: Medicaid - Out of State

## 2012-09-23 ENCOUNTER — Emergency Department (HOSPITAL_COMMUNITY)
Admission: EM | Admit: 2012-09-23 | Discharge: 2012-09-23 | Disposition: A | Discharge status: Home / Self Care | Payer: Medicaid - Out of State | Attending: Emergency Medicine | Admitting: Emergency Medicine

## 2012-09-23 ENCOUNTER — Encounter (HOSPITAL_COMMUNITY): Payer: Self-pay | Admitting: *Deleted

## 2012-09-23 DIAGNOSIS — N83209 Unspecified ovarian cyst, unspecified side: Secondary | ICD-10-CM | POA: Insufficient documentation

## 2012-09-23 DIAGNOSIS — N83201 Unspecified ovarian cyst, right side: Secondary | ICD-10-CM

## 2012-09-23 DIAGNOSIS — R3915 Urgency of urination: Secondary | ICD-10-CM | POA: Insufficient documentation

## 2012-09-23 DIAGNOSIS — R350 Frequency of micturition: Secondary | ICD-10-CM | POA: Insufficient documentation

## 2012-09-23 DIAGNOSIS — Z3202 Encounter for pregnancy test, result negative: Secondary | ICD-10-CM | POA: Insufficient documentation

## 2012-09-23 DIAGNOSIS — R300 Dysuria: Secondary | ICD-10-CM | POA: Insufficient documentation

## 2012-09-23 DIAGNOSIS — N72 Inflammatory disease of cervix uteri: Secondary | ICD-10-CM | POA: Insufficient documentation

## 2012-09-23 LAB — CBC WITH DIFFERENTIAL/PLATELET
Basophils Absolute: 0 10*3/uL (ref 0.0–0.1)
Basophils Relative: 0 % (ref 0–1)
Eosinophils Absolute: 0.4 10*3/uL (ref 0.0–0.7)
Hemoglobin: 13.2 g/dL (ref 12.0–15.0)
MCH: 31.1 pg (ref 26.0–34.0)
MCHC: 34.8 g/dL (ref 30.0–36.0)
Monocytes Relative: 10 % (ref 3–12)
Neutrophils Relative %: 61 % (ref 43–77)
Platelets: 276 10*3/uL (ref 150–400)
RDW: 12.8 % (ref 11.5–15.5)

## 2012-09-23 LAB — COMPREHENSIVE METABOLIC PANEL
ALT: 36 U/L — ABNORMAL HIGH (ref 0–35)
AST: 34 U/L (ref 0–37)
Alkaline Phosphatase: 40 U/L (ref 39–117)
CO2: 25 mEq/L (ref 19–32)
Chloride: 102 mEq/L (ref 96–112)
GFR calc non Af Amer: >90 mL/min (ref >90)
Glucose, Bld: 94 mg/dL (ref 70–99)
Potassium: 3.4 mEq/L — ABNORMAL LOW (ref 3.5–5.1)
Sodium: 137 mEq/L (ref 135–145)
Total Bilirubin: 0.4 mg/dL (ref 0.3–1.2)

## 2012-09-23 LAB — URINALYSIS, ROUTINE W REFLEX MICROSCOPIC
Hgb urine dipstick: NEGATIVE
Nitrite: NEGATIVE
Protein, ur: NEGATIVE mg/dL
Urobilinogen, UA: 0.2 mg/dL (ref 0.0–1.0)

## 2012-09-23 LAB — POCT PREGNANCY, URINE: Preg Test, Ur: NEGATIVE

## 2012-09-23 LAB — WET PREP, GENITAL
Clue Cells Wet Prep HPF POC: NONE SEEN
Trich, Wet Prep: NONE SEEN

## 2012-09-23 MED ORDER — PHENAZOPYRIDINE HCL 200 MG PO TABS: 200.0000 mg | ORAL_TABLET | Freq: Three times a day (TID) | ORAL | Status: DC

## 2012-09-23 NOTE — ED Notes (Signed)
Pt remains in ultrasound.

## 2012-09-23 NOTE — ED Provider Notes (Signed)
Medical screening examination/treatment/procedure(s) were performed by non-physician practitioner and as supervising physician I was immediately available for consultation/collaboration.  Geoffery Lyons, MD 09/23/12 [phone]

## 2012-09-23 NOTE — ED Notes (Addendum)
C/o RLQ and right lower back pain, comes and goes, onset mid march, describes as pressure, reports: frequency, urgency, retention, decreased stream. (denies: fever, dysuria, d/c, itching, bleeding, nvd, dizziness or HA).  no meds PTA. Last ate 1800. Last BM yesterday ("little pellets, not normal"). "Was here yesterday for the same, but had to leave". "Passing lots of gas & belching". Denies hernias or surgeries.

## 2012-09-23 NOTE — ED Provider Notes (Signed)
History     CSN: [id number]  Arrival date & time 09/23/12  4098   First MD Initiated Contact with Patient 09/23/12 [phone]      Chief Complaint  Patient presents with  . Abdominal Pain    (Consider location/radiation/quality/duration/timing/severity/associated sxs/prior treatment) HPI Comments: Patient reports right sided abdominal pain and urinary symptoms that have been ongoing since March.  States the pain is primarily in her right pelvis and radiates into right abdomen, is crampy in nature, worse at night, worse when she doesn't empty her bladder, is 7/10 intensity at its most intense , currently described as a pressure and is mild.  Has frequent sensation of needing to urinate but when she goes she has difficulty starting flow and difficulty emptying bladder, states she has to push hard for urine to come out, also notes that not much comes out when she does.  Has been to urgent care and diagnosed with BV and pelvic pain syndrome, [facility] but she has not been able to follow up due to insurance issues (does have gyn appt for July).  Denies dysuria.  Denies fevers, N/V/D, hematochezia or melena, abnormal vaginal discharge or bleeding.  No personal or family hx kidney problems or kidney stones or ovarian cysts.  Pt has implanon birth control since last August, therefore no regular periods.  Has both protected and unprotected sex.  No hx abdominal surgeries.   Patient is a 22 y.o. female presenting with abdominal pain. The history is provided by the patient.  Abdominal Pain The current episode started more than 1 month ago. The problem occurs intermittently. The problem has been gradually worsening. Associated symptoms include abdominal pain and urinary symptoms. Pertinent negatives include no change in bowel habit, chest pain, chills, coughing, fever, myalgias, nausea or vomiting.    History reviewed. No pertinent past medical history.  History reviewed.  No pertinent past surgical history.  No family history on file.  History  Substance Use Topics  . Smoking status: Never Smoker   . Smokeless tobacco: Not on file  . Alcohol Use: Yes     Comment: occasional    OB History   Grav Para Term Preterm Abortions TAB SAB Ect Mult Living                  Review of Systems  Constitutional: Negative for fever and chills.  Respiratory: Negative for cough and shortness of breath.   Cardiovascular: Negative for chest pain.  Gastrointestinal: Positive for abdominal pain. Negative for nausea, vomiting, diarrhea and change in bowel habit.  Genitourinary: Positive for dysuria, urgency and frequency. Negative for vaginal bleeding, vaginal discharge and menstrual problem.  Musculoskeletal: Negative for myalgias.    Allergies  Review of patient's allergies indicates no known allergies.  Home Medications   Current Outpatient Rx  Name  Route  Sig  Dispense  Refill  . etonogestrel (IMPLANON) 68 MG IMPL implant   Subcutaneous   Inject 1 each into the skin once.         . Multiple Vitamin (MULTIVITAMIN WITH MINERALS) TABS   Oral   Take 1 tablet by mouth daily.           BP 113/76  Pulse 90  Temp(Src) 98.3 F (36.8 C) (Oral)  Resp 16  Ht 5\' 4"  (1.626 m)  Wt 125 lb (56.7 kg)  BMI 21.45 kg/m2  SpO2 100%  LMP 08/29/2012  Physical Exam  Nursing note and vitals reviewed. Constitutional: She  appears well-developed and well-nourished. No distress.  HENT:  Head: Normocephalic and atraumatic.  Neck: Neck supple.  Cardiovascular: Normal rate and regular rhythm.   Pulmonary/Chest: Effort normal and breath sounds normal. No respiratory distress. She has no wheezes. She has no rales.  Abdominal: Soft. She exhibits no distension and no mass. There is tenderness. There is no rigidity, no rebound, no guarding, no CVA tenderness and no tenderness at McBurney's point.    Genitourinary: Vagina normal. Uterus is tender. Cervix exhibits motion  tenderness. Cervix exhibits no discharge and no friability. Right adnexum displays tenderness. Left adnexum displays no tenderness. No vaginal discharge found.  Neurological: She is alert.  Skin: She is not diaphoretic.  Psychiatric: She has a normal mood and affect. Her behavior is normal. Judgment and thought content normal.    ED Course  Procedures (including critical care time)  Labs Reviewed  WET PREP, GENITAL - Abnormal; Notable for the following:    WBC, Wet Prep HPF POC TOO NUMEROUS TO COUNT (*)    All other components within normal limits  COMPREHENSIVE METABOLIC PANEL - Abnormal; Notable for the following:    Potassium 3.4 (*)    ALT 36 (*)    All other components within normal limits  URINALYSIS, ROUTINE W REFLEX MICROSCOPIC - Abnormal; Notable for the following:    APPearance HAZY (*)    All other components within normal limits  GC/CHLAMYDIA PROBE AMP  URINE CULTURE  CBC WITH DIFFERENTIAL  POCT PREGNANCY, URINE   US Transvaginal Non-ob  09/23/2012   *RADIOLOGY REPORT*  Clinical Data: Right adnexal tenderness.  TRANSABDOMINAL AND TRANSVAGINAL ULTRASOUND OF PELVIS  Technique:  Both transabdominal and transvaginal ultrasound examinations of the pelvis were performed including evaluation of the uterus, ovaries, adnexal regions, and pelvic cul-de-sac.  Comparison: None.  Findings:  Uterus: 6.4 x 2.9 x 3.7 cm.  Normal echotexture.  No focal abnormality.  Endometrium: Normal appearance and thickness, 4 mm.  Right Ovary: 4.5 x 1.9 x 2.6 cm.  2.5 cm benign cyst or dominant follicle in the right ovary.  Left Ovary: 2.8 x 1.5 x 2.7 cm. Normal size and echotexture.  No adnexal masses.  Other Findings:  No free fluid.  IMPRESSION: Small benign cyst or dominant follicle the right ovary. Unremarkable study.   Original Report Authenticated By: Charlett Nose, M.D.   US Pelvis Complete  09/23/2012   *RADIOLOGY REPORT*  Clinical Data: Right adnexal tenderness.  TRANSABDOMINAL AND TRANSVAGINAL  ULTRASOUND OF PELVIS  Technique:  Both transabdominal and transvaginal ultrasound examinations of the pelvis were performed including evaluation of the uterus, ovaries, adnexal regions, and pelvic cul-de-sac.  Comparison: None.  Findings:  Uterus: 6.4 x 2.9 x 3.7 cm.  Normal echotexture.  No focal abnormality.  Endometrium: Normal appearance and thickness, 4 mm.  Right Ovary: 4.5 x 1.9 x 2.6 cm.  2.5 cm benign cyst or dominant follicle in the right ovary.  Left Ovary: 2.8 x 1.5 x 2.7 cm. Normal size and echotexture.  No adnexal masses.  Other Findings:  No free fluid.  IMPRESSION: Small benign cyst or dominant follicle the right ovary. Unremarkable study.   Original Report Authenticated By: Charlett Nose, M.D.   US Renal  09/23/2012   *RADIOLOGY REPORT*  Clinical Data: The history of urinary urgency and frequency with sensation of incomplete emptying of urinary bladder.  RENAL/URINARY TRACT ULTRASOUND COMPLETE  Comparison:  None.  Findings:  Right Kidney:  Right renal length is 10.5 cm.  Left Kidney:  Left renal  length is 10.4 cm.  Examination of each kidney shows no evidence of hydronephrosis, solid or cystic mass, calculus, parenchymal loss, or parenchymal textural abnormality.  Bladder:  The urinary bladder is incompletely distended.  No bladder abnormality is demonstrated.  IMPRESSION: No abnormality is identified.   Original Report Authenticated By: Onalee Hua Call    Discussed treatment for cervicitis and patient declines.   1. Right ovarian cyst   2. Urinary urgency   3. Cervicitis     MDM  Pt with several months of right sided pelvic pain with urinary urgency and frequency.  US shows right ovarian cyst.  No renal abnormalities.  Pelvic exam reveals cervicitis with WBC TNTC on wet prep.  Discussed this with patient and she declines treatment at this time pending GC/Chlam cultures.  Doubt PID given exam.  Labs and urine unremarkable.  Pt d/c home with pyridium, urology and gyn follow up.  Urine culture  also sent.  Discussed all results with patient.  Pt given return precautions.  Pt verbalizes understanding and agrees with plan.     I doubt any other EMC precluding discharge at this time including, but not necessarily limited to the following:  PID, ureteral stone, pyelonephritis, ovarian torsion        Trixie Dredge, PA-C 09/23/12 1055

## 2012-09-23 NOTE — ED Notes (Signed)
Patient transported to Ultrasound 

## 2012-09-24 LAB — URINE CULTURE

## 2012-11-06 DIAGNOSIS — R350 Frequency of micturition: Secondary | ICD-10-CM | POA: Diagnosis not present

## 2012-11-06 DIAGNOSIS — N390 Urinary tract infection, site not specified: Secondary | ICD-10-CM | POA: Diagnosis not present

## 2012-11-07 DIAGNOSIS — N390 Urinary tract infection, site not specified: Secondary | ICD-10-CM | POA: Diagnosis not present

## 2012-11-07 DIAGNOSIS — R350 Frequency of micturition: Secondary | ICD-10-CM | POA: Diagnosis not present

## 2012-11-07 DIAGNOSIS — Z01419 Encounter for gynecological examination (general) (routine) without abnormal findings: Secondary | ICD-10-CM | POA: Diagnosis not present

## 2012-11-07 DIAGNOSIS — N760 Acute vaginitis: Secondary | ICD-10-CM | POA: Diagnosis not present

## 2012-11-07 DIAGNOSIS — R231 Pallor: Secondary | ICD-10-CM | POA: Diagnosis not present

## 2012-11-07 DIAGNOSIS — Z3009 Encounter for other general counseling and advice on contraception: Secondary | ICD-10-CM | POA: Diagnosis not present

## 2012-11-07 DIAGNOSIS — Z0000 Encounter for general adult medical examination without abnormal findings: Secondary | ICD-10-CM | POA: Diagnosis not present

## 2012-11-21 ENCOUNTER — Encounter (HOSPITAL_COMMUNITY): Payer: Self-pay

## 2012-11-21 ENCOUNTER — Emergency Department (INDEPENDENT_AMBULATORY_CARE_PROVIDER_SITE_OTHER)
Admission: EM | Admit: 2012-11-21 | Discharge: 2012-11-21 | Disposition: A | Discharge status: Home / Self Care | Payer: PRIVATE HEALTH INSURANCE | Source: Home / Self Care

## 2012-11-21 ENCOUNTER — Emergency Department (INDEPENDENT_AMBULATORY_CARE_PROVIDER_SITE_OTHER): Payer: PRIVATE HEALTH INSURANCE

## 2012-11-21 DIAGNOSIS — R3989 Other symptoms and signs involving the genitourinary system: Secondary | ICD-10-CM

## 2012-11-21 DIAGNOSIS — A499 Bacterial infection, unspecified: Secondary | ICD-10-CM

## 2012-11-21 DIAGNOSIS — N9489 Other specified conditions associated with female genital organs and menstrual cycle: Secondary | ICD-10-CM

## 2012-11-21 DIAGNOSIS — N760 Acute vaginitis: Secondary | ICD-10-CM

## 2012-11-21 DIAGNOSIS — K5900 Constipation, unspecified: Secondary | ICD-10-CM

## 2012-11-21 DIAGNOSIS — K625 Hemorrhage of anus and rectum: Secondary | ICD-10-CM

## 2012-11-21 LAB — POCT I-STAT, CHEM 8
BUN: 6 mg/dL (ref 6–23)
Calcium, Ion: 1.2 mmol/L (ref 1.12–1.23)
Chloride: 103 mEq/L (ref 96–112)
Glucose, Bld: 89 mg/dL (ref 70–99)

## 2012-11-21 MED ORDER — MAGNESIUM CITRATE PO SOLN: 0.5000 | Freq: Once | ORAL | Status: DC

## 2012-11-21 NOTE — ED Notes (Signed)
C/o having hard stool w associated rectal bleeding x past 2-3 days; states her stool was difficult to pass Bleeding only from rectal area, and only w stool. Denies other perineal area bleeding.

## 2012-11-21 NOTE — ED Provider Notes (Signed)
CSN: [id number]     Arrival date & time 11/21/12  1438 History     None    Chief Complaint  Patient presents with  . Rectal Bleeding   (Consider location/radiation/quality/duration/timing/severity/associated sxs/prior Treatment) HPI  22 yo bf presents today with rectal bleed and constipation.  Constipation x one month.  Denies any significant changes other than eating fewer meals possibly.  Denies fever, chills, nausea, vomiting, abd pain.  No wqtery stools.  Last 2-3 days she was has had a few episodes of rectal bleeding.  Some streaking on toilet paper but today had bright red blood in toilet.  Stools have been hard and round.  Recently moved here to attend college.  No primary care yet.   History reviewed. No pertinent past medical history. History reviewed. No pertinent past surgical history. History reviewed. No pertinent family history. History  Substance Use Topics  . Smoking status: Never Smoker   . Smokeless tobacco: Not on file  . Alcohol Use: Yes     Comment: occasional   OB History   Grav Para Term Preterm Abortions TAB SAB Ect Mult Living                 Review of Systems  Constitutional: Negative.   HENT: Negative.   Eyes: Negative.   Respiratory: Negative.   Cardiovascular: Negative.   Gastrointestinal: Positive for constipation, blood in stool, anal bleeding and rectal pain. Negative for nausea, vomiting, abdominal pain, diarrhea and abdominal distention.  Endocrine: Negative.   Genitourinary: Negative.   Allergic/Immunologic: Negative.   Neurological: Negative.     Allergies  Review of patient's allergies indicates no known allergies.  Home Medications   Current Outpatient Rx  Name  Route  Sig  Dispense  Refill  . etonogestrel (IMPLANON) 68 MG IMPL implant   Subcutaneous   Inject 1 each into the skin once.         . magnesium citrate SOLN   Oral   Take 148 mLs (0.5 Bottles total) by mouth once.   300 mL   0   . Multiple Vitamin  (MULTIVITAMIN WITH MINERALS) TABS   Oral   Take 1 tablet by mouth daily.         . phenazopyridine (PYRIDIUM) 200 MG tablet   Oral   Take 1 tablet (200 mg total) by mouth 3 (three) times daily.   6 tablet   0    BP 104/73  Pulse 83  Temp(Src) 98.7 F (37.1 C) (Oral)  Resp 10  LMP 11/07/2012 Physical Exam  Constitutional: She is oriented to person, place, and time. She appears well-developed and well-nourished.  HENT:  Head: Normocephalic and atraumatic.  Eyes: EOM are normal. Pupils are equal, round, and reactive to light.  Neck: Normal range of motion.  Cardiovascular: Normal rate, regular rhythm and normal heart sounds.   Pulmonary/Chest: Effort normal and breath sounds normal.  Abdominal: Soft. Bowel sounds are normal. She exhibits no distension and no mass. There is no tenderness. There is no rebound and no guarding.  Genitourinary:  Good sphincter tone.  Small anal fissue about 2-3 mm.  No bleeding.  No stool in rectum.    Musculoskeletal: Normal range of motion.  Neurological: She is alert and oriented to person, place, and time.  Skin: Skin is warm and dry.  Psychiatric: She has a normal mood and affect.    ED Course   Procedures (including critical care time)  Labs Reviewed  POCT I-STAT, CHEM 8  Dg Abd 1 View  11/21/2012   *RADIOLOGY REPORT*  Clinical Data: Rectal bleeding.  ABDOMEN - 1 VIEW  Comparison: None.  Findings: Stool is noted in the ascending and descending colon.  No bowel dilatation is noted.  Phleboliths are noted in the pelvis. No renal calcifications are noted.  No osseous abnormality is noted.  IMPRESSION: No evidence of bowel obstruction or ileus.   Original Report Authenticated By: Lupita Raider.,  M.D.   1. Constipation   2. Anal bleeding     MDM  Xray reviewed with patient.  Gave script for mag citrate 1/2 bottle.  If not better within next couple of days she will return or go to ED if she continues to have bleeding.    Voices  understanding.  Instructions have diet recommendations.    Meds ordered this encounter  Medications  . magnesium citrate SOLN    Sig: Take 148 mLs (0.5 Bottles total) by mouth once.    Dispense:  300 mL    Refill:  0    Zonia Kief, PA-C 11/21/12 1659

## 2012-11-21 NOTE — ED Provider Notes (Signed)
Medical screening examination/treatment/procedure(s) were performed by non-physician practitioner and as supervising physician I was immediately available for consultation/collaboration.  Leslee Home, M.D.  Reuben Likes, MD 11/21/12 1710
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
## 2013-01-30 DIAGNOSIS — Z3046 Encounter for surveillance of implantable subdermal contraceptive: Secondary | ICD-10-CM | POA: Diagnosis not present

## 2013-01-30 DIAGNOSIS — Z01419 Encounter for gynecological examination (general) (routine) without abnormal findings: Secondary | ICD-10-CM | POA: Diagnosis not present

## 2013-01-30 DIAGNOSIS — R87610 Atypical squamous cells of undetermined significance on cytologic smear of cervix (ASC-US): Secondary | ICD-10-CM | POA: Diagnosis not present

## 2013-02-15 ENCOUNTER — Encounter (HOSPITAL_COMMUNITY): Payer: Self-pay | Admitting: Emergency Medicine

## 2013-02-15 ENCOUNTER — Emergency Department (INDEPENDENT_AMBULATORY_CARE_PROVIDER_SITE_OTHER)
Admission: EM | Admit: 2013-02-15 | Discharge: 2013-02-15 | Disposition: A | Discharge status: Home / Self Care | Payer: PRIVATE HEALTH INSURANCE | Source: Home / Self Care | Attending: Emergency Medicine | Admitting: Emergency Medicine

## 2013-02-15 DIAGNOSIS — J029 Acute pharyngitis, unspecified: Secondary | ICD-10-CM

## 2013-02-15 LAB — POCT RAPID STREP A: Streptococcus, Group A Screen (Direct): NEGATIVE

## 2013-02-15 NOTE — ED Provider Notes (Signed)
Chief Complaint:   Chief Complaint  Patient presents with  . Sore Throat    History of Present Illness:   Jennifer Strong is a 22 year old female who has had a two-day history of sore throat and she saw some white spots on her tonsils. She denies fever, chills, headache, nasal congestion, or rhinorrhea. She's had no adenopathy or neck pain. No coughing or wheezing. No GI symptoms. No exposure to strep or to mono.  Review of Systems:  Other than as noted above, the patient denies any of the following symptoms. Systemic:  No fever, chills, sweats, fatigue, myalgias, headache, or anorexia. Eye:  No redness, pain or drainage. ENT:  No earache, ear congestion, nasal congestion, sneezing, rhinorrhea, sinus pressure, sinus pain, or post nasal drip. Lungs:  No cough, sputum production, wheezing, shortness of breath, or chest pain. GI:  No abdominal pain, nausea, vomiting, or diarrhea. Skin:  No rash or itching.  PMFSH:  Past medical history, family history, social history, meds, allergies, and nurse's notes were reviewed.  There is no known exposure to strep or mono.  No prior history of step or mono.  The patient denies use of tobacco.  Physical Exam:   Vital signs:  BP 103/76  Pulse 64  Temp(Src) 98.2 F (36.8 C) (Oral)  Resp 18  SpO2 100%  LMP 02/01/2013 General:  Alert, in no distress. Eye:  No conjunctival injection or drainage. Lids were normal. ENT:  TMs and canals were normal, without erythema or inflammation.  Nasal mucosa was clear and uncongested, without drainage.  Mucous membranes were moist.  Exam of pharynx reveals erythema and swelling but no exudate.  There were no oral ulcerations or lesions. Neck:  Supple, no adenopathy, tenderness or mass. Lungs:  No respiratory distress.  Lungs were clear to auscultation, without wheezes, rales or rhonchi.  Breath sounds were clear and equal bilaterally.  Heart:  Regular rhythm, without gallops, murmers or rubs. Skin:  Clear, warm,  and dry, without rash or lesions.  Labs:   Results for orders placed during the hospital encounter of 02/15/13  POCT RAPID STREP A ([facility] ONLY)      Result Value Range   Streptococcus, Group A Screen (Direct) NEGATIVE  NEGATIVE   Assessment:  The encounter diagnosis was Viral pharyngitis.  No indication for antibiotics.  Plan:   1.  Meds:  The following meds were prescribed:   Discharge Medication List as of 02/15/2013 12:04 PM      2.  Patient Education/Counseling:  The patient was given appropriate handouts, self care instructions, and instructed in symptomatic relief, including hot saline gargles, throat lozenges, infectious precautions, and need to trade out toothbrush.   3.  Follow up:  The patient was told to follow up if no better in 3 to 4 days, if becoming worse in any way, and given some red flag symptoms such as difficulty swallowing or breathing which would prompt immediate return.  Follow up here as needed.     Reuben Likes, MD 02/15/13 2226

## 2013-02-15 NOTE — ED Notes (Signed)
C/O sore throat x 3 days without any fevers or any other sxs.  Also c/o "small bumps to left side of posterior tongue.

## 2013-02-17 LAB — CULTURE, GROUP A STREP

## 2013-03-06 DIAGNOSIS — R87610 Atypical squamous cells of undetermined significance on cytologic smear of cervix (ASC-US): Secondary | ICD-10-CM | POA: Diagnosis not present

## 2013-03-06 DIAGNOSIS — Z113 Encounter for screening for infections with a predominantly sexual mode of transmission: Secondary | ICD-10-CM | POA: Diagnosis not present

## 2013-03-06 DIAGNOSIS — N739 Female pelvic inflammatory disease, unspecified: Secondary | ICD-10-CM | POA: Diagnosis not present

## 2013-03-23 ENCOUNTER — Emergency Department (INDEPENDENT_AMBULATORY_CARE_PROVIDER_SITE_OTHER)
Admission: EM | Admit: 2013-03-23 | Discharge: 2013-03-23 | Disposition: A | Discharge status: Home / Self Care | Payer: Medicare Other | Source: Home / Self Care | Attending: Family Medicine | Admitting: Family Medicine

## 2013-03-23 ENCOUNTER — Other Ambulatory Visit (HOSPITAL_COMMUNITY)
Admission: RE | Admit: 2013-03-23 | Discharge: 2013-03-23 | Disposition: A | Discharge status: Home / Self Care | Payer: PRIVATE HEALTH INSURANCE | Source: Ambulatory Visit | Attending: Family Medicine | Admitting: Family Medicine

## 2013-03-23 ENCOUNTER — Encounter (HOSPITAL_COMMUNITY): Payer: Self-pay | Admitting: Emergency Medicine

## 2013-03-23 DIAGNOSIS — N898 Other specified noninflammatory disorders of vagina: Secondary | ICD-10-CM

## 2013-03-23 DIAGNOSIS — Z113 Encounter for screening for infections with a predominantly sexual mode of transmission: Secondary | ICD-10-CM | POA: Insufficient documentation

## 2013-03-23 DIAGNOSIS — N760 Acute vaginitis: Secondary | ICD-10-CM | POA: Insufficient documentation

## 2013-03-23 MED ORDER — METRONIDAZOLE 500 MG PO TABS: 500.0000 mg | ORAL_TABLET | Freq: Two times a day (BID) | ORAL | Status: DC

## 2013-03-23 NOTE — ED Notes (Signed)
C/o vaginal discharge x 2 days.  Denies pelvic/abdominal pain.  No odor or irritation.

## 2013-03-23 NOTE — ED Provider Notes (Signed)
Jennifer Strong is a 22 y.o. female who presents to Urgent Care today for vaginal discharge present for 2 days. Patient denies any itching odor or pelvic pain nausea vomiting or diarrhea. She has not tried any medications yet she feels well otherwise. Her last menstrual period was November 1. No dysuria   History reviewed. No pertinent past medical history. History  Substance Use Topics  . Smoking status: Never Smoker   . Smokeless tobacco: Not on file  . Alcohol Use: Yes     Comment: occasional   ROS as above Medications reviewed. No current facility-administered medications for this encounter.   Current Outpatient Prescriptions  Medication Sig Dispense Refill  . etonogestrel (IMPLANON) 68 MG IMPL implant Inject 1 each into the skin once.      . magnesium citrate SOLN Take 148 mLs (0.5 Bottles total) by mouth once.  300 mL  0  . metroNIDAZOLE (FLAGYL) 500 MG tablet Take 1 tablet (500 mg total) by mouth 2 (two) times daily.  14 tablet  0  . Multiple Vitamin (MULTIVITAMIN WITH MINERALS) TABS Take 1 tablet by mouth daily.      . phenazopyridine (PYRIDIUM) 200 MG tablet Take 1 tablet (200 mg total) by mouth 3 (three) times daily.  6 tablet  0    Exam:  BP 108/68  Pulse 75  Temp(Src) 98.4 F (36.9 C) (Oral)  Resp 20  SpO2 99%  LMP 02/28/2013 Gen: Well NAD HEENT: EOMI,  MMM Lungs: Normal work of breathing. CTABL Heart: RRR no MRG Abd: NABS, Soft. NT, ND Exts: Non edematous BL  LE, warm and well perfused.  GYN: Normal external genitalia. Vaginal canal with white discharge. Normal-appearing cervix. Nontender cervix.  No results found for this or any previous visit (from the past 24 hour(s)). No results found.  Assessment and Plan: 22 y.o. female with discharge. Likely BV. Awaiting cytology. Treat empirically with metronidazole.  Discussed warning signs or symptoms. Please see discharge instructions. Patient expresses understanding.      Rodolph Bong, MD 03/23/13  1149

## 2013-03-24 ENCOUNTER — Telehealth (HOSPITAL_COMMUNITY): Payer: Self-pay | Admitting: Family Medicine

## 2013-03-24 ENCOUNTER — Telehealth (HOSPITAL_COMMUNITY): Payer: Self-pay | Admitting: *Deleted

## 2013-03-24 MED ORDER — FLUCONAZOLE 150 MG PO TABS: 150.0000 mg | ORAL_TABLET | Freq: Once | ORAL | Status: DC

## 2013-03-24 NOTE — ED Notes (Signed)
Pos for yeast.  Fluconazole called in.  RN to contact the patient.   Rodolph Bong, MD 03/24/13 [phone]

## 2013-03-24 NOTE — ED Notes (Signed)
GC/Chlamydia neg., Affirm: Candida pos., Gardnerella and Trich neg.  Dr. Denyse Amass e-prescribed Diflucan to the [facility] on [address].  I called pt. Pt. verified x 2 and given results.  Pt. told she needs Diflucan for a yeast infection and where to pick up her Rx.. She asked if she needs to finish the Flagyl. I asked Dr. Denyse Amass and he said no since the bacterial vaginosis test was neg. I told pt. This.  Pt. voiced understanding. Jennifer Strong 03/24/2013

## 2013-05-07 ENCOUNTER — Emergency Department (INDEPENDENT_AMBULATORY_CARE_PROVIDER_SITE_OTHER)
Admission: EM | Admit: 2013-05-07 | Discharge: 2013-05-07 | Disposition: A | Discharge status: Home / Self Care | Payer: PRIVATE HEALTH INSURANCE | Source: Home / Self Care | Attending: Family Medicine | Admitting: Family Medicine

## 2013-05-07 ENCOUNTER — Encounter (HOSPITAL_COMMUNITY): Payer: Self-pay | Admitting: Emergency Medicine

## 2013-05-07 DIAGNOSIS — B373 Candidiasis of vulva and vagina: Secondary | ICD-10-CM | POA: Diagnosis not present

## 2013-05-07 DIAGNOSIS — B3731 Acute candidiasis of vulva and vagina: Secondary | ICD-10-CM

## 2013-05-07 MED ORDER — TERCONAZOLE 80 MG VA SUPP: 80.0000 mg | Freq: Every day | VAGINAL | Status: DC

## 2013-05-07 MED ORDER — FLUCONAZOLE 150 MG PO TABS: 150.0000 mg | ORAL_TABLET | Freq: Once | ORAL | Status: DC

## 2013-05-07 NOTE — ED Provider Notes (Signed)
CSN: [id number]     Arrival date & time 05/07/13  1554 History   First MD Initiated Contact with Patient 05/07/13 1810     Chief Complaint  Patient presents with  . Vaginal Discharge   (Consider location/radiation/quality/duration/timing/severity/associated sxs/prior Treatment) Patient is a 23 y.o. female presenting with vaginal discharge. The history is provided by the patient.  Vaginal Discharge Quality:  White Severity:  Mild Onset quality:  Sudden Duration:  1 day Progression:  Worsening Chronicity:  Recurrent Context comment:  Seen 11/24 for d/c problem, pos for candida, only received diflucan. Associated symptoms: vaginal itching   Associated symptoms: no dysuria, no nausea and no vomiting     History reviewed. No pertinent past medical history. History reviewed. No pertinent past surgical history. No family history on file. History  Substance Use Topics  . Smoking status: Never Smoker   . Smokeless tobacco: Not on file  . Alcohol Use: Yes     Comment: occasional   OB History   Grav Para Term Preterm Abortions TAB SAB Ect Mult Living                 Review of Systems  Constitutional: Negative.   Gastrointestinal: Negative.  Negative for nausea and vomiting.  Genitourinary: Positive for vaginal discharge. Negative for dysuria.    Allergies  Review of patient's allergies indicates no known allergies.  Home Medications   Current Outpatient Rx  Name  Route  Sig  Dispense  Refill  . etonogestrel (IMPLANON) 68 MG IMPL implant   Subcutaneous   Inject 1 each into the skin once.         . fluconazole (DIFLUCAN) 150 MG tablet   Oral   Take 1 tablet (150 mg total) by mouth once.   1 tablet   1   . fluconazole (DIFLUCAN) 150 MG tablet   Oral   Take 1 tablet (150 mg total) by mouth once. Repeat in 1 week.   1 tablet   1   . magnesium citrate SOLN   Oral   Take 148 mLs (0.5 Bottles total) by mouth once.   300 mL   0   . metroNIDAZOLE (FLAGYL) 500 MG  tablet   Oral   Take 1 tablet (500 mg total) by mouth 2 (two) times daily.   14 tablet   0   . Multiple Vitamin (MULTIVITAMIN WITH MINERALS) TABS   Oral   Take 1 tablet by mouth daily.         . phenazopyridine (PYRIDIUM) 200 MG tablet   Oral   Take 1 tablet (200 mg total) by mouth 3 (three) times daily.   6 tablet   0   . terconazole (TERAZOL 3) 80 MG vaginal suppository   Vaginal   Place 1 suppository (80 mg total) vaginally at bedtime.   3 suppository   0    BP 111/64  Pulse 98  Temp(Src) 98.1 F (36.7 C) (Oral)  Resp 18  SpO2 100% Physical Exam  Nursing note and vitals reviewed. Constitutional: She appears well-developed and well-nourished.  Abdominal: Soft. Bowel sounds are normal. She exhibits no mass. There is no tenderness.  Skin: Skin is warm.    ED Course  Procedures (including critical care time) Labs Review Labs Reviewed - No data to display Imaging Review No results found.  EKG Interpretation    Date/Time:    Ventricular Rate:    PR Interval:    QRS Duration:   QT Interval:  QTC Calculation:   R Axis:     Text Interpretation:              MDM      Billy Fischer, MD 05/07/13 Vernelle Emerald

## 2013-05-07 NOTE — ED Notes (Signed)
C/o yeast infection: itchy yesterday, not itchy today, cloudy, pearly discharge, denies urinary symptoms.

## 2013-05-21 DIAGNOSIS — R350 Frequency of micturition: Secondary | ICD-10-CM | POA: Diagnosis not present

## 2013-05-21 DIAGNOSIS — R3915 Urgency of urination: Secondary | ICD-10-CM | POA: Diagnosis not present

## 2013-05-21 DIAGNOSIS — R351 Nocturia: Secondary | ICD-10-CM | POA: Diagnosis not present

## 2013-09-10 ENCOUNTER — Other Ambulatory Visit (HOSPITAL_COMMUNITY)
Admission: RE | Admit: 2013-09-10 | Discharge: 2013-09-10 | Disposition: A | Discharge status: Home / Self Care | Payer: Medicare Other | Source: Ambulatory Visit | Attending: Family Medicine | Admitting: Family Medicine

## 2013-09-10 ENCOUNTER — Emergency Department (INDEPENDENT_AMBULATORY_CARE_PROVIDER_SITE_OTHER)
Admission: EM | Admit: 2013-09-10 | Discharge: 2013-09-10 | Disposition: A | Discharge status: Home / Self Care | Payer: PRIVATE HEALTH INSURANCE | Source: Home / Self Care | Attending: Family Medicine | Admitting: Family Medicine

## 2013-09-10 ENCOUNTER — Encounter (HOSPITAL_COMMUNITY): Payer: Self-pay | Admitting: Emergency Medicine

## 2013-09-10 DIAGNOSIS — N760 Acute vaginitis: Secondary | ICD-10-CM | POA: Insufficient documentation

## 2013-09-10 DIAGNOSIS — Z113 Encounter for screening for infections with a predominantly sexual mode of transmission: Secondary | ICD-10-CM | POA: Insufficient documentation

## 2013-09-10 LAB — POCT URINALYSIS DIP (DEVICE)
BILIRUBIN URINE: NEGATIVE
GLUCOSE, UA: NEGATIVE mg/dL
Hgb urine dipstick: NEGATIVE
Ketones, ur: NEGATIVE mg/dL
LEUKOCYTES UA: NEGATIVE
Nitrite: NEGATIVE
PROTEIN: NEGATIVE mg/dL
Specific Gravity, Urine: >=1.03 (ref 1.005–1.030)
Urobilinogen, UA: 0.2 mg/dL (ref 0.0–1.0)
pH: 6 (ref 5.0–8.0)

## 2013-09-10 LAB — POCT PREGNANCY, URINE: Preg Test, Ur: NEGATIVE

## 2013-09-10 MED ORDER — FLUCONAZOLE 150 MG PO TABS: 150.0000 mg | ORAL_TABLET | Freq: Once | ORAL | Status: DC

## 2013-09-10 MED ORDER — METRONIDAZOLE 500 MG PO TABS: 500.0000 mg | ORAL_TABLET | Freq: Two times a day (BID) | ORAL | Status: DC

## 2013-09-10 NOTE — Discharge Instructions (Signed)
Thank you for coming in today. °If your belly pain worsens, or you have high fever, bad vomiting, blood in your stool or black tarry stool go to the Emergency Room.  ° °Bacterial Vaginosis °Bacterial vaginosis is a vaginal infection that occurs when the normal balance of bacteria in the vagina is disrupted. It results from an overgrowth of certain bacteria. This is the most common vaginal infection in women of childbearing age. Treatment is important to prevent complications, especially in pregnant women, as it can cause a premature delivery. °CAUSES  °Bacterial vaginosis is caused by an increase in harmful bacteria that are normally present in smaller amounts in the vagina. Several different kinds of bacteria can cause bacterial vaginosis. However, the reason that the condition develops is not fully understood. °RISK FACTORS °Certain activities or behaviors can put you at an increased risk of developing bacterial vaginosis, including: °· Having a new sex partner or multiple sex partners. °· Douching. °· Using an intrauterine device (IUD) for contraception. °Women do not get bacterial vaginosis from toilet seats, bedding, swimming pools, or contact with objects around them. °SIGNS AND SYMPTOMS  °Some women with bacterial vaginosis have no signs or symptoms. Common symptoms include: °· Grey vaginal discharge. °· A fishlike odor with discharge, especially after sexual intercourse. °· Itching or burning of the vagina and vulva. °· Burning or pain with urination. °DIAGNOSIS  °Your health care provider will take a medical history and examine the vagina for signs of bacterial vaginosis. A sample of vaginal fluid may be taken. Your health care provider will look at this sample under a microscope to check for bacteria and abnormal cells. A vaginal pH test may also be done.  °TREATMENT  °Bacterial vaginosis may be treated with antibiotic medicines. These may be given in the form of a pill or a vaginal cream. A second round  of antibiotics may be prescribed if the condition comes back after treatment.  °HOME CARE INSTRUCTIONS  °· Only take over-the-counter or prescription medicines as directed by your health care provider. °· If antibiotic medicine was prescribed, take it as directed. Make sure you finish it even if you start to feel better. °· Do not have sex until treatment is completed. °· Tell all sexual partners that you have a vaginal infection. They should see their health care provider and be treated if they have problems, such as a mild rash or itching. °· Practice safe sex by using condoms and only having one sex partner. °SEEK MEDICAL CARE IF:  °· Your symptoms are not improving after 3 days of treatment. °· You have increased discharge or pain. °· You have a fever. °MAKE SURE YOU:  °· Understand these instructions. °· Will watch your condition. °· Will get help right away if you are not doing well or get worse. °FOR MORE INFORMATION  °Centers for Disease Control and Prevention, Division of STD Prevention: www.cdc.gov/std °American Sexual Health Association (ASHA): www.ashastd.org  °Document Released: 04/16/2005 Document Revised: 02/04/2013 Document Reviewed: 11/26/2012 °[employer]® Patient Information ©2014 [employer]. ° °

## 2013-09-10 NOTE — ED Provider Notes (Signed)
Jennifer Strong is a 23 y.o. female who presents to Urgent Care today for vaginal discharge present for one week. No itching. No fevers chills nausea vomiting or diarrhea. No current pelvic pain. No treatments tried. Consistent with prior episodes of yeast infection.   History reviewed. No pertinent past medical history. History  Substance Use Topics  . Smoking status: Never Smoker   . Smokeless tobacco: Not on file  . Alcohol Use: Yes     Comment: occasional   ROS as above Medications: No current facility-administered medications for this encounter.   Current Outpatient Prescriptions  Medication Sig Dispense Refill  . etonogestrel (IMPLANON) 68 MG IMPL implant Inject 1 each into the skin once.      . fluconazole (DIFLUCAN) 150 MG tablet Take 1 tablet (150 mg total) by mouth once.  1 tablet  1  . metroNIDAZOLE (FLAGYL) 500 MG tablet Take 1 tablet (500 mg total) by mouth 2 (two) times daily.  14 tablet  0  . Multiple Vitamin (MULTIVITAMIN WITH MINERALS) TABS Take 1 tablet by mouth daily.        Exam:  BP 111/73  Pulse 73  Temp(Src) 98.3 F (36.8 C) (Oral)  Resp 12  SpO2 100%  LMP 08/21/2013 Gen: Well NAD GYN: Normal external genitalia. Vaginal canal with thin white discharge. Normal-appearing cervix. Nontender.  Results for orders placed during the hospital encounter of 09/10/13 (from the past 24 hour(s))  POCT URINALYSIS DIP (DEVICE)     Status: None   Collection Time    09/10/13  8:16 AM      Result Value Ref Range   Glucose, UA NEGATIVE  NEGATIVE mg/dL   Bilirubin Urine NEGATIVE  NEGATIVE   Ketones, ur NEGATIVE  NEGATIVE mg/dL   Specific Gravity, Urine >=1.030  1.005 - 1.030   Hgb urine dipstick NEGATIVE  NEGATIVE   pH 6.0  5.0 - 8.0   Protein, ur NEGATIVE  NEGATIVE mg/dL   Urobilinogen, UA 0.2  0.0 - 1.0 mg/dL   Nitrite NEGATIVE  NEGATIVE   Leukocytes, UA NEGATIVE  NEGATIVE  POCT PREGNANCY, URINE     Status: None   Collection Time    09/10/13  8:23 AM   Result Value Ref Range   Preg Test, Ur NEGATIVE  NEGATIVE   No results found.  Assessment and Plan: 23 y.o. female with vaginal discharge. Either BV or yeast. Treat for both. Cytology pending.  Discussed warning signs or symptoms. Please see discharge instructions. Patient expresses understanding.    Gregor Hams, MD 09/10/13 0830

## 2013-09-10 NOTE — ED Notes (Signed)
C/o vaginal irritation for over a week.  Mild pelvic discomfort.  Denies concerns for stds.  No otc meds used.

## 2013-10-06 ENCOUNTER — Emergency Department (INDEPENDENT_AMBULATORY_CARE_PROVIDER_SITE_OTHER)
Admission: EM | Admit: 2013-10-06 | Discharge: 2013-10-06 | Disposition: A | Discharge status: Home / Self Care | Payer: PRIVATE HEALTH INSURANCE | Source: Home / Self Care | Attending: Family Medicine | Admitting: Family Medicine

## 2013-10-06 ENCOUNTER — Other Ambulatory Visit (HOSPITAL_COMMUNITY)
Admission: RE | Admit: 2013-10-06 | Discharge: 2013-10-06 | Disposition: A | Discharge status: Home / Self Care | Payer: Medicare Other | Source: Ambulatory Visit | Attending: Family Medicine | Admitting: Family Medicine

## 2013-10-06 ENCOUNTER — Encounter (HOSPITAL_COMMUNITY): Payer: Self-pay | Admitting: Emergency Medicine

## 2013-10-06 DIAGNOSIS — N760 Acute vaginitis: Secondary | ICD-10-CM | POA: Diagnosis not present

## 2013-10-06 DIAGNOSIS — Z113 Encounter for screening for infections with a predominantly sexual mode of transmission: Secondary | ICD-10-CM | POA: Diagnosis not present

## 2013-10-06 MED ORDER — TERCONAZOLE 0.8 % VA CREA: 1.0000 | TOPICAL_CREAM | Freq: Every day | VAGINAL | Status: DC

## 2013-10-06 NOTE — ED Provider Notes (Signed)
Jennifer Strong is a 23 y.o. female who presents to Urgent Care today for vaginal discharge and itching present for 3 days. No fevers or chills nausea vomiting or diarrhea. No urinary symptoms. Patient has intermittent spotting because of Implanon. No abdominal or pelvic pain. Patient suspects that she may have a yeast infection and requests a suppository instead of pills.  She was seen for similar complaint about 3 weeks ago. Cytology was negative.   History reviewed. No pertinent past medical history. History  Substance Use Topics  . Smoking status: Never Smoker   . Smokeless tobacco: Not on file  . Alcohol Use: Yes     Comment: occasional   ROS as above Medications: No current facility-administered medications for this encounter.   Current Outpatient Prescriptions  Medication Sig Dispense Refill  . etonogestrel (IMPLANON) 68 MG IMPL implant Inject 1 each into the skin once.      . Multiple Vitamin (MULTIVITAMIN WITH MINERALS) TABS Take 1 tablet by mouth daily.      Marland Kitchen terconazole (TERAZOL 3) 0.8 % vaginal cream Place 1 applicator vaginally at bedtime. 3 days at night  60 g  0    Exam:  BP 109/57  Pulse 71  Temp(Src) 98.4 F (36.9 C) (Oral)  Resp 16  SpO2 98%  LMP 08/21/2013 Gen: Well NAD GYN: Normal external genitalia. Vaginal canal with thick clumpy white discharge. Normal-appearing cervix. Old blood in the vault. Nontender  No results found for this or any previous visit (from the past 24 hour(s)). No results found.  Assessment and Plan: 23 y.o. female with probable vaginal yeast infection. Cytology pending. Empiric treatment with Terazol cream.    Discussed warning signs or symptoms. Please see discharge instructions. Patient expresses understanding.    Gregor Hams, MD 10/06/13 [phone]

## 2013-10-06 NOTE — Discharge Instructions (Signed)
Thank you for coming in today. Use the cream at night for 3 nights.  I will call with results.  Come back as needed.   Monilial Vaginitis Vaginitis in a soreness, swelling and redness (inflammation) of the vagina and vulva. Monilial vaginitis is not a sexually transmitted infection. CAUSES  Yeast vaginitis is caused by yeast (candida) that is normally found in your vagina. With a yeast infection, the candida has overgrown in number to a point that upsets the chemical balance. SYMPTOMS   White, thick vaginal discharge.  Swelling, itching, redness and irritation of the vagina and possibly the lips of the vagina (vulva).  Burning or painful urination.  Painful intercourse. DIAGNOSIS  Things that may contribute to monilial vaginitis are:  Postmenopausal and virginal states.  Pregnancy.  Infections.  Being tired, sick or stressed, especially if you had monilial vaginitis in the past.  Diabetes. Good control will help lower the chance.  Birth control pills.  Tight fitting garments.  Using bubble bath, feminine sprays, douches or deodorant tampons.  Taking certain medications that kill germs (antibiotics).  Sporadic recurrence can occur if you become ill. TREATMENT  Your caregiver will give you medication.  There are several kinds of anti monilial vaginal creams and suppositories specific for monilial vaginitis. For recurrent yeast infections, use a suppository or cream in the vagina 2 times a week, or as directed.  Anti-monilial or steroid cream for the itching or irritation of the vulva may also be used. Get your caregiver's permission.  Painting the vagina with methylene blue solution may help if the monilial cream does not work.  Eating yogurt may help prevent monilial vaginitis. HOME CARE INSTRUCTIONS   Finish all medication as prescribed.  Do not have sex until treatment is completed or after your caregiver tells you it is okay.  Take warm sitz baths.  Do not  douche.  Do not use tampons, especially scented ones.  Wear cotton underwear.  Avoid tight pants and panty hose.  Tell your sexual partner that you have a yeast infection. They should go to their caregiver if they have symptoms such as mild rash or itching.  Your sexual partner should be treated as well if your infection is difficult to eliminate.  Practice safer sex. Use condoms.  Some vaginal medications cause latex condoms to fail. Vaginal medications that harm condoms are:  Cleocin cream.  Butoconazole (Femstat).  Terconazole (Terazol) vaginal suppository.  Miconazole (Monistat) (may be purchased over the counter). SEEK MEDICAL CARE IF:   You have a temperature by mouth above 102 F (38.9 C).  The infection is getting worse after 2 days of treatment.  The infection is not getting better after 3 days of treatment.  You develop blisters in or around your vagina.  You develop vaginal bleeding, and it is not your menstrual period.  You have pain when you urinate.  You develop intestinal problems.  You have pain with sexual intercourse. Document Released: 01/24/2005 Document Revised: 07/09/2011 Document Reviewed: 10/08/2008 [facility] Patient Information 2014 [place], [place].

## 2013-10-06 NOTE — ED Notes (Signed)
C/o vaginal irritation similar to previous yeast problems. Declines STD testing, denies any chance of pregnancy

## 2013-10-08 NOTE — ED Notes (Signed)
GC/Chlamydia neg., Affirm: Candida pos., Gardnerella and Trich neg.  Pt. adequately treated with Terazol cream. Jennifer Strong 10/08/2013

## 2013-11-10 ENCOUNTER — Emergency Department (INDEPENDENT_AMBULATORY_CARE_PROVIDER_SITE_OTHER)
Admission: EM | Admit: 2013-11-10 | Discharge: 2013-11-10 | Disposition: A | Discharge status: Home / Self Care | Payer: PRIVATE HEALTH INSURANCE | Source: Home / Self Care | Attending: Family Medicine | Admitting: Family Medicine

## 2013-11-10 ENCOUNTER — Other Ambulatory Visit (HOSPITAL_COMMUNITY)
Admission: RE | Admit: 2013-11-10 | Discharge: 2013-11-10 | Disposition: A | Discharge status: Home / Self Care | Payer: Medicare Other | Source: Ambulatory Visit | Attending: Family Medicine | Admitting: Family Medicine

## 2013-11-10 ENCOUNTER — Encounter (HOSPITAL_COMMUNITY): Payer: Self-pay | Admitting: Emergency Medicine

## 2013-11-10 DIAGNOSIS — Z113 Encounter for screening for infections with a predominantly sexual mode of transmission: Secondary | ICD-10-CM | POA: Diagnosis not present

## 2013-11-10 DIAGNOSIS — N760 Acute vaginitis: Secondary | ICD-10-CM

## 2013-11-10 LAB — GLUCOSE, CAPILLARY: Glucose-Capillary: 89 mg/dL (ref 70–99)

## 2013-11-10 NOTE — ED Notes (Signed)
Patient reports she has a yeast infection.  Symptoms for 2 days.  Reports itchiness and foul odor.  Patient has frequent yeast infection

## 2013-11-10 NOTE — ED Provider Notes (Signed)
CSN: [id number]     Arrival date & time 11/10/13  0808 History   First MD Initiated Contact with Patient 11/10/13 0818     Chief Complaint  Patient presents with  . Vaginal Discharge   (Consider location/radiation/quality/duration/timing/severity/associated sxs/prior Treatment) Patient is a 23 y.o. female presenting with vaginal discharge. The history is provided by the patient.  Vaginal Discharge Quality:  Malodorous Severity:  Mild Onset quality:  Gradual Duration:  2 days Timing:  Constant Progression:  Unchanged Chronicity:  Recurrent Relieved by:  None tried Worsened by:  Nothing tried Ineffective treatments:  None tried Associated symptoms: no abdominal pain, no dysuria, no fever, no genital lesions, no nausea, no rash, no urinary frequency, no urinary hesitancy, no urinary incontinence, no vaginal itching and no vomiting     History reviewed. No pertinent past medical history. History reviewed. No pertinent past surgical history. No family history on file. History  Substance Use Topics  . Smoking status: Never Smoker   . Smokeless tobacco: Not on file  . Alcohol Use: Yes     Comment: occasional   OB History   Grav Para Term Preterm Abortions TAB SAB Ect Mult Living                 Review of Systems  Constitutional: Negative for fever.  Gastrointestinal: Negative for nausea, vomiting and abdominal pain.  Genitourinary: Positive for vaginal discharge. Negative for bladder incontinence, dysuria and hesitancy.  All other systems reviewed and are negative.   Allergies  Review of patient's allergies indicates no known allergies.  Home Medications   Prior to Admission medications   Medication Sig Start Date End Date Taking? Authorizing Provider  etonogestrel (IMPLANON) 68 MG IMPL implant Inject 1 each into the skin once.    Historical Provider, MD  Multiple Vitamin (MULTIVITAMIN WITH MINERALS) TABS Take 1 tablet by mouth daily.    Historical Provider, MD   terconazole (TERAZOL 3) 0.8 % vaginal cream Place 1 applicator vaginally at bedtime. 3 days at night 10/06/13   Gregor Hams, MD   BP 120/83  Pulse 78  Temp(Src) 98.1 F (36.7 C) (Oral)  Resp 18  SpO2 99%  LMP 11/02/2013 Physical Exam  Nursing note and vitals reviewed. Constitutional: She is oriented to person, place, and time. She appears well-developed and well-nourished. No distress.  HENT:  Head: Normocephalic and atraumatic.  Eyes: Conjunctivae are normal. No scleral icterus.  Cardiovascular: Normal rate.   Pulmonary/Chest: Effort normal.  Genitourinary: Uterus normal. Pelvic exam was performed with patient supine. There is no rash, tenderness or lesion on the right labia. There is no rash, tenderness or lesion on the left labia. Cervix exhibits discharge and friability. Cervix exhibits no motion tenderness. Right adnexum displays no mass, no tenderness and no fullness. Left adnexum displays no mass, no tenderness and no fullness. No erythema, tenderness or bleeding around the vagina. No foreign body around the vagina. No signs of injury around the vagina. Vaginal discharge found.  Musculoskeletal: Normal range of motion.  Neurological: She is alert and oriented to person, place, and time.  Skin: Skin is warm and dry. No rash noted. No erythema.  Psychiatric: Her behavior is normal.    ED Course  Procedures (including critical care time) Labs Review Labs Reviewed  GLUCOSE, CAPILLARY  CERVICOVAGINAL ANCILLARY ONLY    Imaging Review No results found.   MDM   1. Vaginitis    Advised patient regarding several small friable lesions on cervix and recommended she locate  an ObGyn of her choice for follow up examination. Given the frequency with which patient has symptoms of vaginitis and vaginal discharge and multiple treatments tried in the past, will wait for swab results to determine best course of treatment. Patient understands the importance of ObGyn follow up and that she  will be notified of results by phonoe if they indicate the need for treatment.     [place], [place] 11/10/13 [phone]

## 2013-11-10 NOTE — Discharge Instructions (Signed)
I would expect that your lab results will be back in the next 12-24 hours and you will contacted by phone if they indicate the need for treatment. Any necessary medications can be called in to your pharmacy of choice.   Vaginitis Vaginitis is an inflammation of the vagina. It is most often caused by a change in the normal balance of the bacteria and yeast that live in the vagina. This change in balance causes an overgrowth of certain bacteria or yeast, which causes the inflammation. There are different types of vaginitis, but the most common types are:  Bacterial vaginosis.  Yeast infection (candidiasis).  Trichomoniasis vaginitis. This is a sexually transmitted infection (STI).  Viral vaginitis.  Atropic vaginitis.  Allergic vaginitis. CAUSES  The cause depends on the type of vaginitis. Vaginitis can be caused by:  Bacteria (bacterial vaginosis).  Yeast (yeast infection).  A parasite (trichomoniasis vaginitis)  A virus (viral vaginitis).  Low hormone levels (atrophic vaginitis). Low hormone levels can occur during pregnancy, breastfeeding, or after menopause.  Irritants, such as bubble baths, scented tampons, and feminine sprays (allergic vaginitis). Other factors can change the normal balance of the yeast and bacteria that live in the vagina. These include:  Antibiotic medicines.  Poor hygiene.  Diaphragms, vaginal sponges, spermicides, birth control pills, and intrauterine devices (IUD).  Sexual intercourse.  Infection.  Uncontrolled diabetes.  A weakened immune system. SYMPTOMS  Symptoms can vary depending on the cause of the vaginitis. Common symptoms include:  Abnormal vaginal discharge.  The discharge is white, gray, or yellow with bacterial vaginosis.  The discharge is thick, white, and cheesy with a yeast infection.  The discharge is frothy and yellow or greenish with trichomoniasis.  A bad vaginal odor.  The odor is fishy with bacterial  vaginosis.  Vaginal itching, pain, or swelling.  Painful intercourse.  Pain or burning when urinating. Sometimes, there are no symptoms. TREATMENT  Treatment will vary depending on the type of infection.   Bacterial vaginosis and trichomoniasis are often treated with antibiotic creams or pills.  Yeast infections are often treated with antifungal medicines, such as vaginal creams or suppositories.  Viral vaginitis has no cure, but symptoms can be treated with medicines that relieve discomfort. Your sexual partner should be treated as well.  Atrophic vaginitis may be treated with an estrogen cream, pill, suppository, or vaginal ring. If vaginal dryness occurs, lubricants and moisturizing creams may help. You may be told to avoid scented soaps, sprays, or douches.  Allergic vaginitis treatment involves quitting the use of the product that is causing the problem. Vaginal creams can be used to treat the symptoms. HOME CARE INSTRUCTIONS   Take all medicines as directed by your caregiver.  Keep your genital area clean and dry. Avoid soap and only rinse the area with water.  Avoid douching. It can remove the healthy bacteria in the vagina.  Do not use tampons or have sexual intercourse until your vaginitis has been treated. Use sanitary pads while you have vaginitis.  Wipe from front to back. This avoids the spread of bacteria from the rectum to the vagina.  Let air reach your genital area.  Wear cotton underwear to decrease moisture buildup.  Avoid wearing underwear while you sleep until your vaginitis is gone.  Avoid tight pants and underwear or nylons without a cotton panel.  Take off wet clothing (especially bathing suits) as soon as possible.  Use mild, non-scented products. Avoid using irritants, such as:  Scented feminine sprays.  Fabric  softeners.  Scented detergents.  Scented tampons.  Scented soaps or bubble baths.  Practice safe sex and use condoms. Condoms  may prevent the spread of trichomoniasis and viral vaginitis. SEEK MEDICAL CARE IF:   You have abdominal pain.  You have a fever or persistent symptoms for more than 2-3 days.  You have a fever and your symptoms suddenly get worse. Document Released: 02/11/2007 Document Revised: 01/09/2012 Document Reviewed: 09/27/2011 [facility] Patient Information 2015 [place], [place]. This information is not intended to replace advice given to you by your health care provider. Make sure you discuss any questions you have with your health care provider.

## 2013-11-11 NOTE — ED Provider Notes (Signed)
Medical screening examination/treatment/procedure(s) were performed by resident physician or non-physician practitioner and as supervising physician I was immediately available for consultation/collaboration.   Pauline Good MD.   Billy Fischer, MD 11/11/13 1420

## 2013-11-14 NOTE — ED Notes (Signed)
Pt. called for her lab results on VM 7/16 and 7/17.  I called pt. back and left a message.  She called back.  Pt. verified x 2 and given results. (GC/Chlamydia and Affirm).  Pt. told all results were neg. and that is why we did not call her.  Roselyn Meier 11/14/2013

## 2013-11-21 ENCOUNTER — Other Ambulatory Visit (HOSPITAL_COMMUNITY)
Admission: RE | Admit: 2013-11-21 | Discharge: 2013-11-21 | Disposition: A | Discharge status: Home / Self Care | Payer: Medicare Other | Source: Ambulatory Visit | Attending: Family Medicine | Admitting: Family Medicine

## 2013-11-21 ENCOUNTER — Emergency Department (INDEPENDENT_AMBULATORY_CARE_PROVIDER_SITE_OTHER)
Admission: EM | Admit: 2013-11-21 | Discharge: 2013-11-21 | Disposition: A | Discharge status: Home / Self Care | Payer: PRIVATE HEALTH INSURANCE | Source: Home / Self Care | Attending: Family Medicine | Admitting: Family Medicine

## 2013-11-21 ENCOUNTER — Encounter (HOSPITAL_COMMUNITY): Payer: Self-pay | Admitting: Emergency Medicine

## 2013-11-21 DIAGNOSIS — B373 Candidiasis of vulva and vagina: Secondary | ICD-10-CM | POA: Diagnosis not present

## 2013-11-21 DIAGNOSIS — Z113 Encounter for screening for infections with a predominantly sexual mode of transmission: Secondary | ICD-10-CM | POA: Insufficient documentation

## 2013-11-21 DIAGNOSIS — B3731 Acute candidiasis of vulva and vagina: Secondary | ICD-10-CM | POA: Diagnosis not present

## 2013-11-21 DIAGNOSIS — Z711 Person with feared health complaint in whom no diagnosis is made: Secondary | ICD-10-CM

## 2013-11-21 DIAGNOSIS — N760 Acute vaginitis: Secondary | ICD-10-CM | POA: Insufficient documentation

## 2013-11-21 LAB — POCT URINALYSIS DIP (DEVICE)
Bilirubin Urine: NEGATIVE
Glucose, UA: NEGATIVE mg/dL
Hgb urine dipstick: NEGATIVE
Ketones, ur: NEGATIVE mg/dL
Nitrite: NEGATIVE
PH: 7.5 (ref 5.0–8.0)
Protein, ur: NEGATIVE mg/dL
Specific Gravity, Urine: 1.02 (ref 1.005–1.030)
Urobilinogen, UA: 0.2 mg/dL (ref 0.0–1.0)

## 2013-11-21 LAB — POCT PREGNANCY, URINE: PREG TEST UR: NEGATIVE

## 2013-11-21 MED ORDER — FLUCONAZOLE 150 MG PO TABS: 150.0000 mg | ORAL_TABLET | Freq: Every day | ORAL | Status: DC

## 2013-11-21 NOTE — Discharge Instructions (Signed)
You most likely have a yeast infection This will clear with the medication  Please discuss long term therapy with your regular doctor or OBGYN We will call you if any of your other results come back positive   Candida Infection A Candida infection (also called yeast, fungus, and Monilia infection) is an overgrowth of yeast that can occur anywhere on the body. A yeast infection commonly occurs in warm, moist body areas. Usually, the infection remains localized but can spread to become a systemic infection. A yeast infection may be a sign of a more severe disease such as diabetes, leukemia, or AIDS. A yeast infection can occur in both men and women. In women, Candida vaginitis is a vaginal infection. It is one of the most common causes of vaginitis. Men usually do not have symptoms or know they have an infection until other problems develop. Men may find out they have a yeast infection because their sex partner has a yeast infection. Uncircumcised men are more likely to get a yeast infection than circumcised men. This is because the uncircumcised glans is not exposed to air and does not remain as dry as that of a circumcised glans. Older adults may develop yeast infections around dentures. CAUSES  Women  Antibiotics.  Steroid medication taken for a long time.  Being overweight (obese).  Diabetes.  Poor immune condition.  Certain serious medical conditions.  Immune suppressive medications for organ transplant patients.  Chemotherapy.  Pregnancy.  Menstruation.  Stress and fatigue.  Intravenous drug use.  Oral contraceptives.  Wearing tight-fitting clothes in the crotch area.  Catching it from a sex partner who has a yeast infection.  Spermicide.  Intravenous, urinary, or other catheters. Men  Catching it from a sex partner who has a yeast infection.  Having oral or anal sex with a person who has the infection.  Spermicide.  Diabetes.  Antibiotics.  Poor immune  system.  Medications that suppress the immune system.  Intravenous drug use.  Intravenous, urinary, or other catheters. SYMPTOMS  Women  Thick, white vaginal discharge.  Vaginal itching.  Redness and swelling in and around the vagina.  Irritation of the lips of the vagina and perineum.  Blisters on the vaginal lips and perineum.  Painful sexual intercourse.  Low blood sugar (hypoglycemia).  Painful urination.  Bladder infections.  Intestinal problems such as constipation, indigestion, bad breath, bloating, increase in gas, diarrhea, or loose stools. Men  Men may develop intestinal problems such as constipation, indigestion, bad breath, bloating, increase in gas, diarrhea, or loose stools.  Dry, cracked skin on the penis with itching or discomfort.  Jock itch.  Dry, flaky skin.  Athlete's foot.  Hypoglycemia. DIAGNOSIS  Women  A history and an exam are performed.  The discharge may be examined under a microscope.  A culture may be taken of the discharge. Men  A history and an exam are performed.  Any discharge from the penis or areas of cracked skin will be looked at under the microscope and cultured.  Stool samples may be cultured. TREATMENT  Women  Vaginal antifungal suppositories and creams.  Medicated creams to decrease irritation and itching on the outside of the vagina.  Warm compresses to the perineal area to decrease swelling and discomfort.  Oral antifungal medications.  Medicated vaginal suppositories or cream for repeated or recurrent infections.  Wash and dry the irritation areas before applying the cream.  Eating yogurt with Lactobacillus may help with prevention and treatment.  Sometimes painting the vagina  with gentian violet solution may help if creams and suppositories do not work. Men  Antifungal creams and oral antifungal medications.  Sometimes treatment must continue for 30 days after the symptoms go away to prevent  recurrence. HOME CARE INSTRUCTIONS  Women  Use cotton underwear and avoid tight-fitting clothing.  Avoid colored, scented toilet paper and deodorant tampons or pads.  Do not douche.  Keep your diabetes under control.  Finish all the prescribed medications.  Keep your skin clean and dry.  Consume milk or yogurt with Lactobacillus-active culture regularly. If you get frequent yeast infections and think that is what the infection is, there are over-the-counter medications that you can get. If the infection does not show healing in 3 days, talk to your caregiver.  Tell your sex partner you have a yeast infection. Your partner may need treatment also, especially if your infection does not clear up or recurs. Men  Keep your skin clean and dry.  Keep your diabetes under control.  Finish all prescribed medications.  Tell your sex partner that you have a yeast infection so he or she can be treated if necessary. SEEK MEDICAL CARE IF:   Your symptoms do not clear up or worsen in one week after treatment.  You have an oral temperature above 102 F (38.9 C).  You have trouble swallowing or eating for a prolonged time.  You develop blisters on and around your vagina.  You develop vaginal bleeding and it is not your menstrual period.  You develop abdominal pain.  You develop intestinal problems as mentioned above.  You get weak or light-headed.  You have painful or increased urination.  You have pain during sexual intercourse. MAKE SURE YOU:   Understand these instructions.  Will watch your condition.  Will get help right away if you are not doing well or get worse. Document Released: 05/24/2004 Document Revised: 08/31/2013 Document Reviewed: 09/05/2009 [facility] Patient Information 2015 [place], [place]. This information is not intended to replace advice given to you by your health care provider. Make sure you discuss any questions you have with your health care  provider.

## 2013-11-21 NOTE — ED Notes (Addendum)
C/o yeast infection States discharge started Wednesday and itchiness on Thursday States she would like to be checked for an uti also

## 2013-11-21 NOTE — ED Provider Notes (Signed)
CSN: [id number]     Arrival date & time 11/21/13  0902 History   First MD Initiated Contact with Patient 11/21/13 0914     Chief Complaint  Patient presents with  . Vaginitis   (Consider location/radiation/quality/duration/timing/severity/associated sxs/prior Treatment) HPI  Vaginal irritation: associated w/ discharge and dysparunea. Non foul smelling. Denies dysuria, frequency, abd pain. Denies any recent abx or long soaks in the tub. Started on Wed. Getting worse. Pt gets yeast infections 7-8 times per year.     History reviewed. No pertinent past medical history. History reviewed. No pertinent past surgical history. History reviewed. No pertinent family history. History  Substance Use Topics  . Smoking status: Never Smoker   . Smokeless tobacco: Not on file  . Alcohol Use: Yes     Comment: occasional   OB History   Grav Para Term Preterm Abortions TAB SAB Ect Mult Living                 Review of Systems Per HPI with all other pertinent systems negative.   Allergies  Review of patient's allergies indicates no known allergies.  Home Medications   Prior to Admission medications   Medication Sig Start Date End Date Taking? Authorizing Provider  etonogestrel (IMPLANON) 68 MG IMPL implant Inject 1 each into the skin once.    Historical Provider, MD  fluconazole (DIFLUCAN) 150 MG tablet Take 1 tablet (150 mg total) by mouth daily. Repeat dose in 3 days 11/21/13   Waldemar Dickens, MD  Multiple Vitamin (MULTIVITAMIN WITH MINERALS) TABS Take 1 tablet by mouth daily.    Historical Provider, MD  terconazole (TERAZOL 3) 0.8 % vaginal cream Place 1 applicator vaginally at bedtime. 3 days at night 10/06/13   Gregor Hams, MD   BP 101/66  Pulse 79  Temp(Src) 99 F (37.2 C) (Oral)  Resp 16  SpO2 99%  LMP 11/02/2013 Physical Exam  Vitals reviewed. Constitutional: She is oriented to person, place, and time. She appears well-developed and well-nourished. No distress.  HENT:  Head:  Normocephalic and atraumatic.  Eyes: EOM are normal. Pupils are equal, round, and reactive to light.  Neck: Normal range of motion.  Pulmonary/Chest: Effort normal. No respiratory distress.  Abdominal: She exhibits no distension.  Genitourinary:  Thick white discharge at vaginal opening and throughout the vagina. No lesions. Vagina and cervix tender on exam.   Musculoskeletal: Normal range of motion. She exhibits no edema and no tenderness.  Neurological: She is alert and oriented to person, place, and time. She exhibits normal muscle tone.  Skin: Skin is warm and dry. No rash noted. She is not diaphoretic. No erythema.  Psychiatric: She has a normal mood and affect. Her behavior is normal. Judgment and thought content normal.    ED Course  Procedures (including critical care time) Labs Review Labs Reviewed  POCT URINALYSIS DIP (DEVICE) - Abnormal; Notable for the following:    Leukocytes, UA SMALL (*)    All other components within normal limits  POCT PREGNANCY, URINE    Imaging Review No results found.   MDM   1. Yeast vaginitis   2. Concern about STD in female without diagnosis    Most likely yeast vaginitis given h/o recurrent infections and based on exam today - diflucan x2 - STD panel Precautions given and all questions answered   Linna Darner, MD Family Medicine 11/21/2013, 9:31 AM      Waldemar Dickens, MD 11/21/13 [phone]

## 2013-11-24 NOTE — ED Notes (Signed)
GC/Chlamydia neg., Affirm: Candida pos., Gardnerella and Trich neg.  Pt. adequately treated with Diflucan. Roselyn Meier 11/24/2013

## 2013-11-26 ENCOUNTER — Other Ambulatory Visit (HOSPITAL_COMMUNITY)
Admission: RE | Admit: 2013-11-26 | Discharge: 2013-11-26 | Disposition: A | Discharge status: Home / Self Care | Payer: Medicare Other | Source: Ambulatory Visit | Attending: Emergency Medicine | Admitting: Emergency Medicine

## 2013-11-26 ENCOUNTER — Emergency Department (INDEPENDENT_AMBULATORY_CARE_PROVIDER_SITE_OTHER)
Admission: EM | Admit: 2013-11-26 | Discharge: 2013-11-26 | Disposition: A | Discharge status: Home / Self Care | Payer: PRIVATE HEALTH INSURANCE | Source: Home / Self Care | Attending: Family Medicine | Admitting: Family Medicine

## 2013-11-26 ENCOUNTER — Encounter (HOSPITAL_COMMUNITY): Payer: Self-pay | Admitting: Emergency Medicine

## 2013-11-26 DIAGNOSIS — N760 Acute vaginitis: Secondary | ICD-10-CM

## 2013-11-26 DIAGNOSIS — Z113 Encounter for screening for infections with a predominantly sexual mode of transmission: Secondary | ICD-10-CM | POA: Diagnosis not present

## 2013-11-26 LAB — POCT URINALYSIS DIP (DEVICE)
Bilirubin Urine: NEGATIVE
Glucose, UA: NEGATIVE mg/dL
Hgb urine dipstick: NEGATIVE
Ketones, ur: NEGATIVE mg/dL
Leukocytes, UA: NEGATIVE
NITRITE: NEGATIVE
PROTEIN: NEGATIVE mg/dL
Specific Gravity, Urine: 1.02 (ref 1.005–1.030)
UROBILINOGEN UA: 0.2 mg/dL (ref 0.0–1.0)
pH: 7 (ref 5.0–8.0)

## 2013-11-26 LAB — POCT PREGNANCY, URINE: Preg Test, Ur: NEGATIVE

## 2013-11-26 MED ORDER — METRONIDAZOLE 500 MG PO TABS: 500.0000 mg | ORAL_TABLET | Freq: Two times a day (BID) | ORAL | Status: DC

## 2013-11-26 MED ORDER — FLUCONAZOLE 150 MG PO TABS: 150.0000 mg | ORAL_TABLET | ORAL | Status: AC

## 2013-11-26 NOTE — ED Provider Notes (Signed)
CSN: [id number]     Arrival date & time 11/26/13  0830 History   First MD Initiated Contact with Patient 11/26/13 [phone]     Chief Complaint  Patient presents with  . Vaginal Discharge  . Medication Refill   (Consider location/radiation/quality/duration/timing/severity/associated sxs/prior Treatment) HPI Comments: 23 year old female with history of recurrent vaginitis presents complaining of vaginal discharge and possible genital herpes outbreak. She was seen here 6 days ago for vaginal discharge, positive for Candida, other swabs negative. She denies any risk of STDs since that time. She is having some vaginal irritation and very slight discharge, she is requesting refills of her medication that she was given at the previous visit. Also she has a slight sensation of a scratch on her labia since the day after she was here last time. She is concerned she may have a herpes outbreak. She was previously treated for genital herpes in 2013 with acyclovir. At that time, she had a positive HSV 1 antibody, no viral cultures were performed. She says the area of the scratching sensation is improving but she doesn't know if she needs medication. No systemic symptoms or pelvic pain.   History reviewed. No pertinent past medical history. History reviewed. No pertinent past surgical history. No family history on file. History  Substance Use Topics  . Smoking status: Never Smoker   . Smokeless tobacco: Not on file  . Alcohol Use: Yes     Comment: occasional   OB History   Grav Para Term Preterm Abortions TAB SAB Ect Mult Living                 Review of Systems  Genitourinary: Positive for vaginal discharge, genital sores and vaginal pain (irritation and lesion, see history of present illness). Negative for dysuria, frequency, hematuria, vaginal bleeding, menstrual problem and pelvic pain.  All other systems reviewed and are negative.   Allergies  Review of patient's allergies indicates no known  allergies.  Home Medications   Prior to Admission medications   Medication Sig Start Date End Date Taking? Authorizing Provider  fluconazole (DIFLUCAN) 150 MG tablet Take 1 tablet (150 mg total) by mouth daily. Repeat dose in 3 days 11/21/13  Yes Waldemar Dickens, MD  etonogestrel (IMPLANON) 68 MG IMPL implant Inject 1 each into the skin once.    Historical Provider, MD  fluconazole (DIFLUCAN) 150 MG tablet Take 1 tablet (150 mg total) by mouth every other day. 11/26/13 12/03/13  Freeman Caldron Murice Barbar, PA-C  metroNIDAZOLE (FLAGYL) 500 MG tablet Take 1 tablet (500 mg total) by mouth 2 (two) times daily. 11/26/13   Liam Graham, PA-C  Multiple Vitamin (MULTIVITAMIN WITH MINERALS) TABS Take 1 tablet by mouth daily.    Historical Provider, MD  terconazole (TERAZOL 3) 0.8 % vaginal cream Place 1 applicator vaginally at bedtime. 3 days at night 10/06/13   Gregor Hams, MD   BP 103/74  Pulse 84  Temp(Src) 98.8 F (37.1 C) (Oral)  Resp 18  SpO2 100%  LMP 11/02/2013 Physical Exam  Nursing note and vitals reviewed. Constitutional: She is oriented to person, place, and time. Vital signs are normal. She appears well-developed and well-nourished. No distress.  HENT:  Head: Normocephalic and atraumatic.  Cardiovascular: Normal rate, regular rhythm and normal heart sounds.   Pulmonary/Chest: Effort normal and breath sounds normal. No respiratory distress.  Abdominal: Soft. She exhibits no mass. There is no tenderness. There is no rebound and no guarding.  Genitourinary: There is no lesion on  the right labia. There is lesion (She has what appears to actually be a scratch between the labia majora and labia minora, irritated, minimally tender, not vesicular or ulcerated) on the left labia. Cervix exhibits no discharge. No erythema or bleeding around the vagina. Vaginal discharge (Thin, white, no appreciable odor) found.  Lymphadenopathy:       Right: No inguinal adenopathy present.       Left: No inguinal  adenopathy present.  Neurological: She is alert and oriented to person, place, and time. She has normal strength. Coordination normal.  Skin: Skin is warm and dry. No rash noted. She is not diaphoretic.  Psychiatric: She has a normal mood and affect. Judgment normal.    ED Course  Procedures (including critical care time) Labs Review Labs Reviewed  POCT URINALYSIS DIP (DEVICE)  POCT PREGNANCY, URINE  CERVICOVAGINAL ANCILLARY ONLY  CERVICOVAGINAL ANCILLARY ONLY    Imaging Review No results found.   MDM   1. Vaginitis    Treated for BV and Candida. At the time of this note, the affirm has come back negative for Gardnerella, Candida, or yeast. Cytology pending. Also, her complaint is not consistent with a herpes outbreak, more consistent with a minor abrasion. She has no pain and no inguinal adenopathy. She will followup as needed if symptoms do not resolve, but I have recommended her to followup with GYN for evaluation of the recurrent vaginitis.   Meds ordered this encounter  Medications  . metroNIDAZOLE (FLAGYL) 500 MG tablet    Sig: Take 1 tablet (500 mg total) by mouth 2 (two) times daily.    Dispense:  14 tablet    Refill:  0    Order Specific Question:  Supervising Provider    Answer:  Lynne Leader, [place]lu[place]  . fluconazole (DIFLUCAN) 150 MG tablet    Sig: Take 1 tablet (150 mg total) by mouth every other day.    Dispense:  3 tablet    Refill:  0    Order Specific Question:  Supervising Provider    Answer:  Lynne Leader, [place]     Liam Graham, PA-C 11/26/13 2204

## 2013-11-26 NOTE — ED Notes (Signed)
Pt reports persistent vag d/c; seen here on 7/14 Treated w/diflucan; no relief.  Denies abd pain, urinary sx Also needing refill on Valtrex Alert w/no signs of acute distress.

## 2013-11-26 NOTE — Discharge Instructions (Signed)

## 2013-11-27 NOTE — ED Provider Notes (Signed)
Medical screening examination/treatment/procedure(s) were performed by a resident physician or non-physician practitioner and as the supervising physician I was immediately available for consultation/collaboration.  Lynne Leader, MD    Gregor Hams, MD 11/27/13 [phone]

## 2013-12-07 DIAGNOSIS — R87610 Atypical squamous cells of undetermined significance on cytologic smear of cervix (ASC-US): Secondary | ICD-10-CM | POA: Diagnosis not present

## 2013-12-07 DIAGNOSIS — Z113 Encounter for screening for infections with a predominantly sexual mode of transmission: Secondary | ICD-10-CM | POA: Diagnosis not present

## 2014-01-18 DIAGNOSIS — N949 Unspecified condition associated with female genital organs and menstrual cycle: Secondary | ICD-10-CM | POA: Diagnosis not present

## 2014-01-28 DIAGNOSIS — R1031 Right lower quadrant pain: Secondary | ICD-10-CM | POA: Diagnosis not present

## 2014-02-08 ENCOUNTER — Other Ambulatory Visit: Payer: Self-pay | Admitting: Obstetrics & Gynecology

## 2014-02-08 DIAGNOSIS — N870 Mild cervical dysplasia: Secondary | ICD-10-CM | POA: Diagnosis not present

## 2014-02-08 DIAGNOSIS — N879 Dysplasia of cervix uteri, unspecified: Secondary | ICD-10-CM | POA: Diagnosis not present

## 2014-02-08 DIAGNOSIS — D260 Other benign neoplasm of cervix uteri: Secondary | ICD-10-CM | POA: Diagnosis not present

## 2014-05-02 ENCOUNTER — Encounter (HOSPITAL_COMMUNITY): Payer: Self-pay | Admitting: Emergency Medicine

## 2014-05-02 ENCOUNTER — Emergency Department (HOSPITAL_COMMUNITY)
Admission: EM | Admit: 2014-05-02 | Discharge: 2014-05-02 | Disposition: A | Discharge status: Home / Self Care | Payer: Medicare Other | Attending: Emergency Medicine | Admitting: Emergency Medicine

## 2014-05-02 DIAGNOSIS — R002 Palpitations: Secondary | ICD-10-CM | POA: Insufficient documentation

## 2014-05-02 DIAGNOSIS — Z79899 Other long term (current) drug therapy: Secondary | ICD-10-CM | POA: Insufficient documentation

## 2014-05-02 LAB — CBC WITH DIFFERENTIAL/PLATELET
Basophils Absolute: 0.1 10*3/uL (ref 0.0–0.1)
Basophils Relative: 1 % (ref 0–1)
EOS PCT: 2 % (ref 0–5)
Eosinophils Absolute: 0.2 10*3/uL (ref 0.0–0.7)
HEMATOCRIT: 33.6 % — AB (ref 36.0–46.0)
HEMOGLOBIN: 11.1 g/dL — AB (ref 12.0–15.0)
LYMPHS ABS: 2.6 10*3/uL (ref 0.7–4.0)
LYMPHS PCT: 31 % (ref 12–46)
MCH: 29.2 pg (ref 26.0–34.0)
MCHC: 33 g/dL (ref 30.0–36.0)
MCV: 88.4 fL (ref 78.0–100.0)
MONOS PCT: 8 % (ref 3–12)
Monocytes Absolute: 0.7 10*3/uL (ref 0.1–1.0)
Neutro Abs: 4.8 10*3/uL (ref 1.7–7.7)
Neutrophils Relative %: 58 % (ref 43–77)
Platelets: 272 10*3/uL (ref 150–400)
RBC: 3.8 MIL/uL — AB (ref 3.87–5.11)
RDW: 13 % (ref 11.5–15.5)
WBC: 8.3 10*3/uL (ref 4.0–10.5)

## 2014-05-02 LAB — COMPREHENSIVE METABOLIC PANEL
ALBUMIN: 4 g/dL (ref 3.5–5.2)
ALT: 11 U/L (ref 0–35)
AST: 27 U/L (ref 0–37)
Alkaline Phosphatase: 39 U/L (ref 39–117)
Anion gap: 4 — ABNORMAL LOW (ref 5–15)
BUN: 9 mg/dL (ref 6–23)
CALCIUM: 9.2 mg/dL (ref 8.4–10.5)
CO2: 27 mmol/L (ref 19–32)
CREATININE: 0.86 mg/dL (ref 0.50–1.10)
Chloride: 103 mEq/L (ref 96–112)
GFR calc Af Amer: >90 mL/min (ref >90)
GFR calc non Af Amer: >90 mL/min (ref >90)
Glucose, Bld: 112 mg/dL — ABNORMAL HIGH (ref 70–99)
Potassium: 3.3 mmol/L — ABNORMAL LOW (ref 3.5–5.1)
Sodium: 134 mmol/L — ABNORMAL LOW (ref 135–145)
TOTAL PROTEIN: 7.4 g/dL (ref 6.0–8.3)
Total Bilirubin: 0.5 mg/dL (ref 0.3–1.2)

## 2014-05-02 NOTE — Discharge Instructions (Signed)
As discussed, your evaluation today has been largely reassuring.  But, it is important that you monitor your condition carefully, and do not hesitate to return to the ED if you develop new, or concerning changes in your condition. ? ?Otherwise, please follow-up with your physician for appropriate ongoing care. ? ?

## 2014-05-02 NOTE — ED Notes (Signed)
The pt has had episodes of  An irregular heart  Beat  Especially at night since Thursday.  She does not feel stressed.  She has a history of the same.  lmp now

## 2014-05-02 NOTE — ED Provider Notes (Signed)
CSN: [id number]     Arrival date & time 05/02/14  2104 History   First MD Initiated Contact with Patient 05/02/14 2127     Chief Complaint  Patient presents with  . Palpitations     (Consider location/radiation/quality/duration/timing/severity/associated sxs/prior Treatment) HPI Patient presents with concern of ongoing palpitations. This episode began approximately 3 days ago, since onset has been intermittent. No associated pain, mild dyspnea, no syncope, no nausea, vomiting. No cough, congestion.  She has had multiple prior episodes, though none in the past weeks.  No prior etiology found.  She does not smoke, drink, has no recent travel history, no unusual work history.     History reviewed. No pertinent past medical history. History reviewed. No pertinent past surgical history. No family history on file. History  Substance Use Topics  . Smoking status: Never Smoker   . Smokeless tobacco: Not on file  . Alcohol Use: Yes     Comment: occasional   OB History    No data available     Review of Systems  Constitutional:       Per HPI, otherwise negative  HENT:       Per HPI, otherwise negative  Respiratory:       Per HPI, otherwise negative  Cardiovascular:       Per HPI, otherwise negative  Gastrointestinal: Negative for vomiting.  Endocrine:       Negative aside from HPI  Genitourinary:       Neg aside from HPI   Musculoskeletal:       Per HPI, otherwise negative  Skin: Negative.   Neurological: Negative for syncope.      Allergies  Review of patient's allergies indicates no known allergies.  Home Medications   Prior to Admission medications   Medication Sig Start Date End Date Taking? Authorizing Provider  Multiple Vitamin (MULTIVITAMIN WITH MINERALS) TABS Take 1 tablet by mouth daily.   Yes Historical Provider, MD  Omega-3 Fatty Acids (OMEGA 3 PO) Take 1 capsule by mouth daily.   Yes Historical Provider, MD  fluconazole (DIFLUCAN) 150 MG tablet  Take 1 tablet (150 mg total) by mouth daily. Repeat dose in 3 days Patient not taking: Reported on 05/02/2014 11/21/13   Waldemar Dickens, MD  metroNIDAZOLE (FLAGYL) 500 MG tablet Take 1 tablet (500 mg total) by mouth 2 (two) times daily. Patient not taking: Reported on 05/02/2014 11/26/13   Liam Graham, PA-C  terconazole (TERAZOL 3) 0.8 % vaginal cream Place 1 applicator vaginally at bedtime. 3 days at night Patient not taking: Reported on 05/02/2014 10/06/13   Gregor Hams, MD   BP 108/75 mmHg  Pulse 75  Temp(Src) 98 F (36.7 C) (Oral)  Resp 18  Ht 5\' 4"  (1.626 m)  Wt 131 lb (59.421 kg)  BMI 22.47 kg/m2  SpO2 99%  LMP 04/30/2014 Physical Exam  Constitutional: She is oriented to person, place, and time. She appears well-developed and well-nourished. No distress.  HENT:  Head: Normocephalic and atraumatic.  Eyes: Conjunctivae and EOM are normal.  Cardiovascular: Normal rate and regular rhythm.   Pulmonary/Chest: Effort normal and breath sounds normal. No stridor. No respiratory distress.  Abdominal: She exhibits no distension.  Musculoskeletal: She exhibits no edema.  Neurological: She is alert and oriented to person, place, and time. No cranial nerve deficit.  Skin: Skin is warm and dry.  Psychiatric: She has a normal mood and affect.  Nursing note and vitals reviewed.   ED Course  Procedures (including  critical care time) Labs Review Labs Reviewed  COMPREHENSIVE METABOLIC PANEL - Abnormal; Notable for the following:    Sodium 134 (*)    Potassium 3.3 (*)    Glucose, Bld 112 (*)    Anion gap 4 (*)    All other components within normal limits  CBC WITH DIFFERENTIAL - Abnormal; Notable for the following:    RBC 3.80 (*)    Hemoglobin 11.1 (*)    HCT 33.6 (*)    All other components within normal limits     EKG Interpretation   Date/Time:  Sunday May 02 2014 21:12:55 EST Ventricular Rate:  68 PR Interval:  150 QRS Duration: 78 QT Interval:  360 QTC Calculation:  382 R Axis:   84 Text Interpretation:  Normal sinus rhythm Normal ECG Sinus rhythm Normal  ECG Confirmed by Carmin Muskrat  MD [phone]) on 05/02/2014 9:27:15 PM      MDM  This well-appearing young female presents with palpitations. Patient has no chest pain, is hemodynamically stable, in no distress throughout hours of morning here. Patient has no risk factors for PE, nor ACS. No evidence for infection. Patient was discharged to follow-up with cardiology for consideration of Holter monitoring.    Carmin Muskrat, MD 05/02/14 2302

## 2014-05-02 NOTE — ED Notes (Signed)
Pt. reports intermittent palpitations " my heart feels like it's trembling " , onset last Thursday , denies chest pain , mild SOB , no cough or congestion .

## 2014-05-04 ENCOUNTER — Encounter: Payer: Self-pay | Admitting: Cardiology

## 2014-05-04 ENCOUNTER — Ambulatory Visit (INDEPENDENT_AMBULATORY_CARE_PROVIDER_SITE_OTHER): Payer: Medicare Other | Admitting: Cardiology

## 2014-05-04 VITALS — BP 110/70 | HR 69 | Ht 64.0 in | Wt 130.0 lb

## 2014-05-04 DIAGNOSIS — R002 Palpitations: Secondary | ICD-10-CM | POA: Diagnosis not present

## 2014-05-04 DIAGNOSIS — E876 Hypokalemia: Secondary | ICD-10-CM | POA: Insufficient documentation

## 2014-05-04 NOTE — Patient Instructions (Signed)
The current medical regimen is effective;  continue present plan and medications.  Please increase intake of foods high in potassium. (bannana, citrus fruits, dried fruits, nut, potatoes, tomatoes etc.)  Your physician has recommended that you wear a holter monitor. Holter monitors are medical devices that record the heart's electrical activity. Doctors most often use these monitors to diagnose arrhythmias. Arrhythmias are problems with the speed or rhythm of the heartbeat. The monitor is a small, portable device. You can wear one while you do your normal daily activities. This is usually used to diagnose what is causing palpitations/syncope (passing out).  Further follow up will be based on these results.

## 2014-05-04 NOTE — Progress Notes (Signed)
      [place]. [address], [address], [place]  [address] Phone: [phone] Fax:  [phone]  Date:  05/04/2014   ID:  Jennifer Strong, DOB 02-14-1991, MRN [id number]  PCP:  Pcp Not In System   History of Present Illness: Jennifer Strong is a 24 y.o. female here for evaluation of palpitations. She was in the emergency department on 05/02/14 with 3 day history of palpitations, intermittent with no chest pain, no syncope, no vomiting, no cough. She's had multiple prior episodes. Feels like a shaking, like she can't breathe. Off and on. More so at night. No EtohNo smoking, no drinking, no recent travel. No early family history of sudden cardiac death or CAD.   Wt Readings from Last 3 Encounters:  05/04/14 130 lb (58.968 kg)  05/02/14 131 lb (59.421 kg)  09/23/12 125 lb (56.7 kg)     No past medical history on file.  No past surgical history on file.  Current Outpatient Prescriptions  Medication Sig Dispense Refill  . Multiple Vitamin (MULTIVITAMIN WITH MINERALS) TABS Take 1 tablet by mouth daily.    . Omega-3 Fatty Acids (OMEGA 3 PO) Take 1 capsule by mouth daily.     No current facility-administered medications for this visit.    Allergies:   No Known Allergies  Social History:  The patient  reports that she has never smoked. She does not have any smokeless tobacco history on file. She reports that she drinks alcohol. She reports that she does not use illicit drugs.   Family History  Problem Relation Age of Onset  . Heart attack Maternal Aunt   . Hypertension Maternal Aunt   . Hypertension Maternal Grandfather     ROS:  Please see the history of present illness.   Denies any fevers, chills, orthopnea, PND, strokelike symptoms.   All other systems reviewed and negative.   PHYSICAL EXAM: VS:  BP 110/70 mmHg  Pulse 69  Ht 5\' 4"  (1.626 m)  Wt 130 lb (58.968 kg)  BMI 22.30 kg/m2  LMP 04/30/2014 Well nourished, well developed, in no acute distress HEENT:  normal, [place]/AT, EOMI Neck: no JVD, normal carotid upstroke, no bruit Cardiac:  normal S1, S2; RRR; no murmur Lungs:  clear to auscultation bilaterally, no wheezing, rhonchi or rales Abd: soft, nontender, no hepatomegaly, no bruits Ext: no edema, 2+ distal pulses Skin: warm and dry GU: deferred Neuro: no focal abnormalities noted, AAO x 3  EKG:  05/04/14-sinus rhythm, 69, no other abnormalities    LABS: K 3.3  ASSESSMENT AND PLAN:  1. Palpitations-we will check a 24-hour Holter monitor. Likely PVC or PAC. She does not describe prolonged racing such as SVT. No high-risk symptoms such as syncope. Treat conservatively for now. If symptoms worsen, could consider low-dose beta blocker. Also, increased potassium, potassium was 3.3. Orange juice, bananas. If symptoms worsen, could also consider checking TSH in future. EKG reassuring.  Signed, Candee Furbish, MD [facility]  05/04/2014 2:32 PM

## 2014-05-06 ENCOUNTER — Encounter: Payer: Self-pay | Admitting: *Deleted

## 2014-05-06 ENCOUNTER — Encounter (INDEPENDENT_AMBULATORY_CARE_PROVIDER_SITE_OTHER): Payer: Medicare Other

## 2014-05-06 DIAGNOSIS — R002 Palpitations: Secondary | ICD-10-CM

## 2014-05-06 NOTE — Progress Notes (Signed)
Patient ID: Jennifer Strong, female   DOB: 03-Jul-1990, 24 y.o.   MRN: [id number] [employer] 24 hour holter monitor applied to patient.

## 2014-05-10 ENCOUNTER — Telehealth: Payer: Self-pay | Admitting: Cardiology

## 2014-05-10 NOTE — Telephone Encounter (Signed)
New Message        Pt calling stating that she dropped off a heart monitor on Friday and wants to know when she can get the results. Please call back and advise.

## 2014-05-10 NOTE — Telephone Encounter (Signed)
Returned patient's call. Informed patient that it takes several days to get monitor results, then Dr. Marlou Porch has to read the results. Informed patient that the earliest she would hear anything on the results would be Friday when Dr. Marlou Porch is back in the office. Patient was agreeable to this plan and verbalized understanding.

## 2014-06-14 ENCOUNTER — Other Ambulatory Visit (HOSPITAL_COMMUNITY)
Admission: RE | Admit: 2014-06-14 | Discharge: 2014-06-14 | Disposition: A | Discharge status: Home / Self Care | Payer: Medicare Other | Source: Ambulatory Visit | Attending: Family Medicine | Admitting: Family Medicine

## 2014-06-14 ENCOUNTER — Emergency Department (INDEPENDENT_AMBULATORY_CARE_PROVIDER_SITE_OTHER)
Admission: EM | Admit: 2014-06-14 | Discharge: 2014-06-14 | Disposition: A | Discharge status: Home / Self Care | Payer: Medicare Other | Source: Home / Self Care | Attending: Family Medicine | Admitting: Family Medicine

## 2014-06-14 ENCOUNTER — Encounter (HOSPITAL_COMMUNITY): Payer: Self-pay | Admitting: *Deleted

## 2014-06-14 DIAGNOSIS — Z113 Encounter for screening for infections with a predominantly sexual mode of transmission: Secondary | ICD-10-CM | POA: Diagnosis not present

## 2014-06-14 DIAGNOSIS — N760 Acute vaginitis: Secondary | ICD-10-CM | POA: Diagnosis not present

## 2014-06-14 DIAGNOSIS — R350 Frequency of micturition: Secondary | ICD-10-CM | POA: Diagnosis not present

## 2014-06-14 LAB — POCT PREGNANCY, URINE: Preg Test, Ur: NEGATIVE

## 2014-06-14 LAB — POCT URINALYSIS DIP (DEVICE)
BILIRUBIN URINE: NEGATIVE
GLUCOSE, UA: NEGATIVE mg/dL
HGB URINE DIPSTICK: NEGATIVE
KETONES UR: NEGATIVE mg/dL
LEUKOCYTES UA: NEGATIVE
Nitrite: NEGATIVE
Protein, ur: NEGATIVE mg/dL
Specific Gravity, Urine: 1.015 (ref 1.005–1.030)
Urobilinogen, UA: 0.2 mg/dL (ref 0.0–1.0)
pH: 7.5 (ref 5.0–8.0)

## 2014-06-14 MED ORDER — CEPHALEXIN 500 MG PO CAPS: 500.0000 mg | ORAL_CAPSULE | Freq: Two times a day (BID) | ORAL | Status: DC

## 2014-06-14 MED ORDER — METRONIDAZOLE 500 MG PO TABS: 500.0000 mg | ORAL_TABLET | Freq: Two times a day (BID) | ORAL | Status: DC

## 2014-06-14 NOTE — ED Provider Notes (Signed)
Shenell C Ureste is a 24 y.o. female who presents to Urgent Care today for urinary frequency urgency present for a few days. No significant or new vaginal discharge. No abdominal or pelvic pain. No fevers or chills. No treatment tried yet.   History reviewed. No pertinent past medical history. History reviewed. No pertinent past surgical history. History  Substance Use Topics  . Smoking status: Never Smoker   . Smokeless tobacco: Not on file  . Alcohol Use: Yes     Comment: occasional   ROS as above Medications: No current facility-administered medications for this encounter.   Current Outpatient Prescriptions  Medication Sig Dispense Refill  . metroNIDAZOLE (FLAGYL) 500 MG tablet Take 1 tablet (500 mg total) by mouth 2 (two) times daily. 14 tablet 0  . Multiple Vitamin (MULTIVITAMIN WITH MINERALS) TABS Take 1 tablet by mouth daily.    . Omega-3 Fatty Acids (OMEGA 3 PO) Take 1 capsule by mouth daily.     No Known Allergies   Exam:  BP 118/80 mmHg  Pulse 73  Resp 16  Ht 5\' 4"  (1.626 m)  Wt 129 lb (58.514 kg)  BMI 22.13 kg/m2  SpO2 100%  LMP 05/26/2014 Gen: Well NAD HEENT: EOMI,  MMM Lungs: Normal work of breathing. CTABL Heart: RRR no MRG Abd: NABS, Soft. Nondistended, Nontender no CVA tenderness to percussion Exts: Brisk capillary refill, warm and well perfused.   GYN: Normal external genitalia. Vaginal canal with thin white discharge. Normal-appearing cervix. Nontender  Results for orders placed or performed during the hospital encounter of 06/14/14 (from the past 24 hour(s))  POCT urinalysis dip (device)     Status: None   Collection Time: 06/14/14 12:15 PM  Result Value Ref Range   Glucose, UA NEGATIVE NEGATIVE mg/dL   Bilirubin Urine NEGATIVE NEGATIVE   Ketones, ur NEGATIVE NEGATIVE mg/dL   Specific Gravity, Urine 1.015 1.005 - 1.030   Hgb urine dipstick NEGATIVE NEGATIVE   pH 7.5 5.0 - 8.0   Protein, ur NEGATIVE NEGATIVE mg/dL   Urobilinogen, UA 0.2 0.0 -  1.0 mg/dL   Nitrite NEGATIVE NEGATIVE   Leukocytes, UA NEGATIVE NEGATIVE  Pregnancy, urine POC     Status: None   Collection Time: 06/14/14 12:20 PM  Result Value Ref Range   Preg Test, Ur NEGATIVE NEGATIVE   No results found.  Assessment and Plan: 24 y.o. female with vaginitis. Cytology pending for gonorrhea Chlamydia trichomonas BV and yeast. Urine culture pending. Treat empirically with Flagyl  Discussed warning signs or symptoms. Please see discharge instructions. Patient expresses understanding.     Gregor Hams, MD 06/14/14 1300

## 2014-06-14 NOTE — ED Notes (Signed)
Pt   Reports  Symptoms  Of  Vaginal  Discharge   And  Frequent  Urination      Since  Last  Week   denys  Any pain  Or bleeding      Pt  States  She  Wants  To  Be  Checked  For      Std

## 2014-06-14 NOTE — Discharge Instructions (Signed)
Thank you for coming in today. Take flagyl twice daily for 1 week.  Do not take with alcohol.  I will call if any labs come back positive.     Bacterial Vaginosis Bacterial vaginosis is a vaginal infection that occurs when the normal balance of bacteria in the vagina is disrupted. It results from an overgrowth of certain bacteria. This is the most common vaginal infection in women of childbearing age. Treatment is important to prevent complications, especially in pregnant women, as it can cause a premature delivery. CAUSES  Bacterial vaginosis is caused by an increase in harmful bacteria that are normally present in smaller amounts in the vagina. Several different kinds of bacteria can cause bacterial vaginosis. However, the reason that the condition develops is not fully understood. RISK FACTORS Certain activities or behaviors can put you at an increased risk of developing bacterial vaginosis, including:  Having a new sex partner or multiple sex partners.  Douching.  Using an intrauterine device (IUD) for contraception. Women do not get bacterial vaginosis from toilet seats, bedding, swimming pools, or contact with objects around them. SIGNS AND SYMPTOMS  Some women with bacterial vaginosis have no signs or symptoms. Common symptoms include:  Grey vaginal discharge.  A fishlike odor with discharge, especially after sexual intercourse.  Itching or burning of the vagina and vulva.  Burning or pain with urination. DIAGNOSIS  Your health care provider will take a medical history and examine the vagina for signs of bacterial vaginosis. A sample of vaginal fluid may be taken. Your health care provider will look at this sample under a microscope to check for bacteria and abnormal cells. A vaginal pH test may also be done.  TREATMENT  Bacterial vaginosis may be treated with antibiotic medicines. These may be given in the form of a pill or a vaginal cream. A second round of antibiotics may  be prescribed if the condition comes back after treatment.  HOME CARE INSTRUCTIONS   Only take over-the-counter or prescription medicines as directed by your health care provider.  If antibiotic medicine was prescribed, take it as directed. Make sure you finish it even if you start to feel better.  Do not have sex until treatment is completed.  Tell all sexual partners that you have a vaginal infection. They should see their health care provider and be treated if they have problems, such as a mild rash or itching.  Practice safe sex by using condoms and only having one sex partner. SEEK MEDICAL CARE IF:   Your symptoms are not improving after 3 days of treatment.  You have increased discharge or pain.  You have a fever. MAKE SURE YOU:   Understand these instructions.  Will watch your condition.  Will get help right away if you are not doing well or get worse. FOR MORE INFORMATION  Centers for Disease Control and Prevention, Division of STD Prevention: [employer]raiser[employer]raud[employer] American Sexual Health Association (ASHA): www.ashastd.org  Document Released: 04/16/2005 Document Revised: 02/04/2013 Document Reviewed: 11/26/2012 [facility] Patient Information 2015 [place], [place]. This information is not intended to replace advice given to you by your health care provider. Make sure you discuss any questions you have with your health care provider.

## 2014-06-15 LAB — CERVICOVAGINAL ANCILLARY ONLY
Chlamydia: NEGATIVE
Neisseria Gonorrhea: NEGATIVE
WET PREP (BD AFFIRM): NEGATIVE
Wet Prep (BD Affirm): NEGATIVE
Wet Prep (BD Affirm): NEGATIVE

## 2014-06-15 LAB — URINE CULTURE
Colony Count: NO GROWTH
Culture: NO GROWTH
Special Requests: NORMAL

## 2014-06-15 LAB — HIV ANTIBODY (ROUTINE TESTING W REFLEX): HIV SCREEN 4TH GENERATION: NONREACTIVE

## 2014-06-15 LAB — RPR: RPR: NONREACTIVE

## 2014-06-16 ENCOUNTER — Telehealth (HOSPITAL_COMMUNITY): Payer: Self-pay | Admitting: *Deleted

## 2014-06-16 NOTE — ED Notes (Signed)
Pt. called for her results.  I called pt. back.  Pt. verified x 2 and given neg. results.  She asked if she still needed to take the medicine (flagyl). I told her it was not necessary. Roselyn Meier 06/16/2014

## 2014-07-21 IMAGING — US US RENAL
1 series · 14 of 23 positions shown · non-contrast
Comparison: None.

CLINICAL DATA: The history of urinary urgency and frequency with
sensation of incomplete emptying of urinary bladder.

RENAL/URINARY TRACT ULTRASOUND COMPLETE

[Series 1: us renal · 0.24mm/px · 14 of 23 slices shown]
[im 1/23]
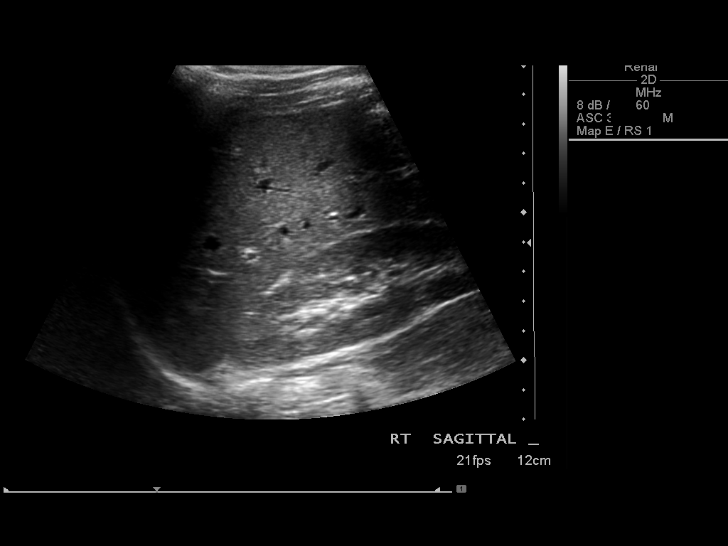
[im 3/23]
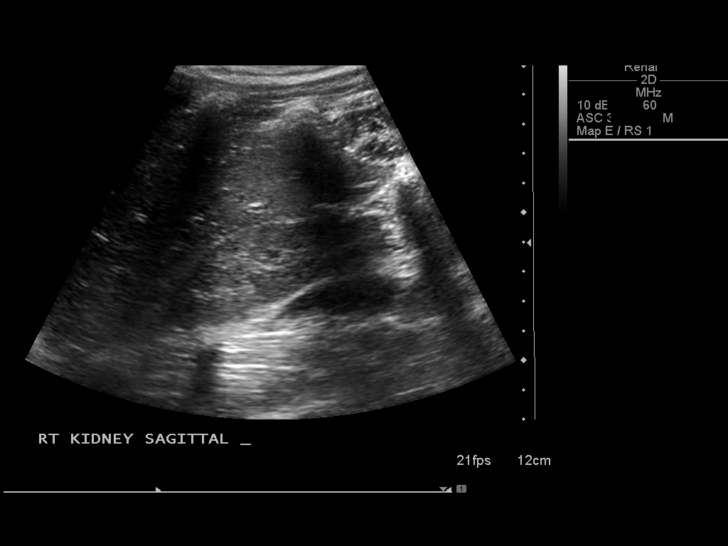
[im 5/23]
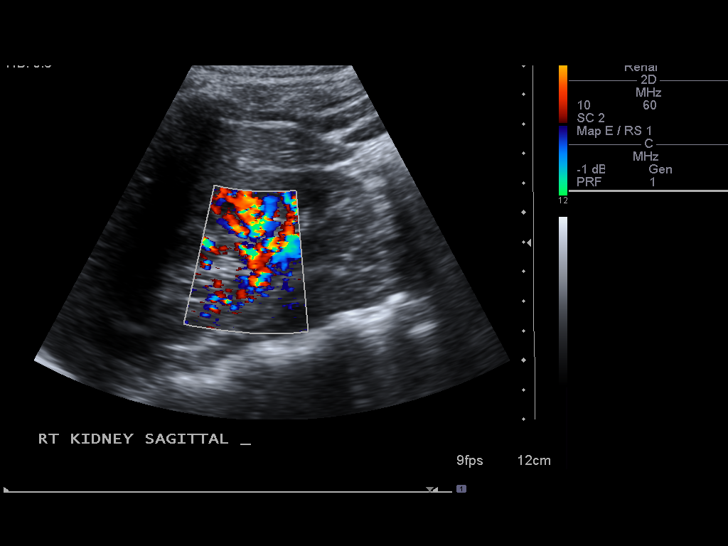
[im 6/23]
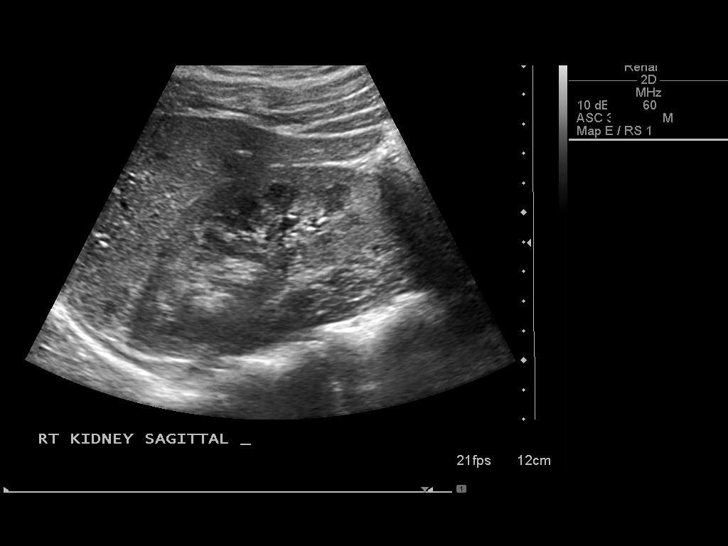
[im 8/23]
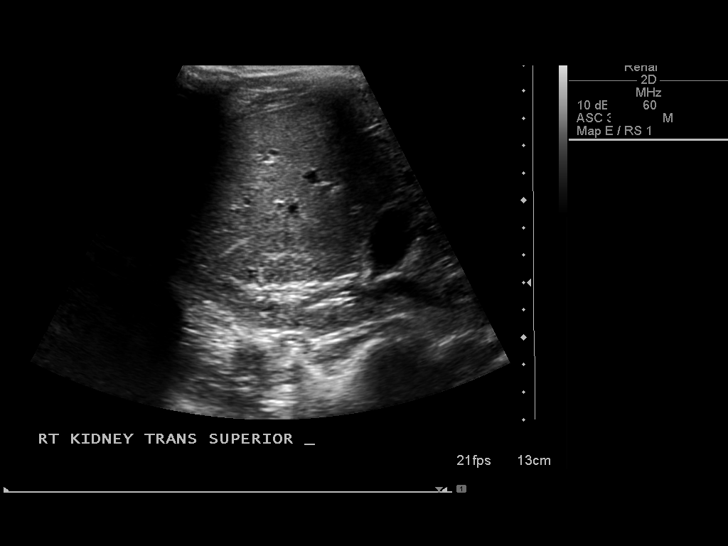
[im 10/23]
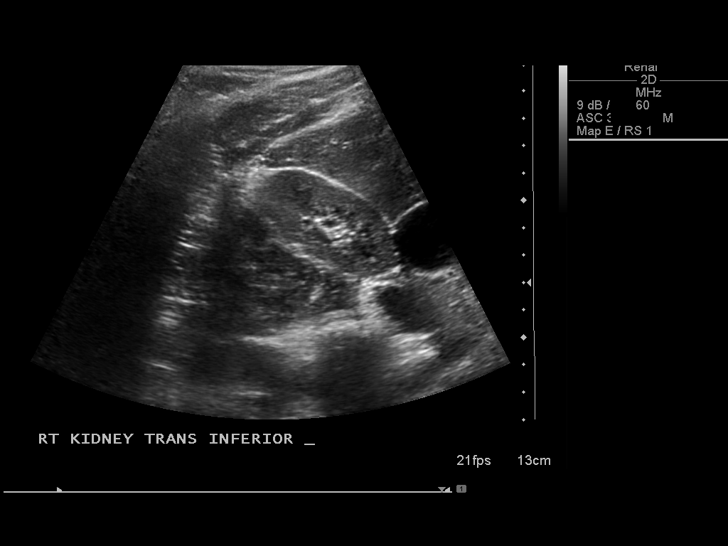
[im 11/23]
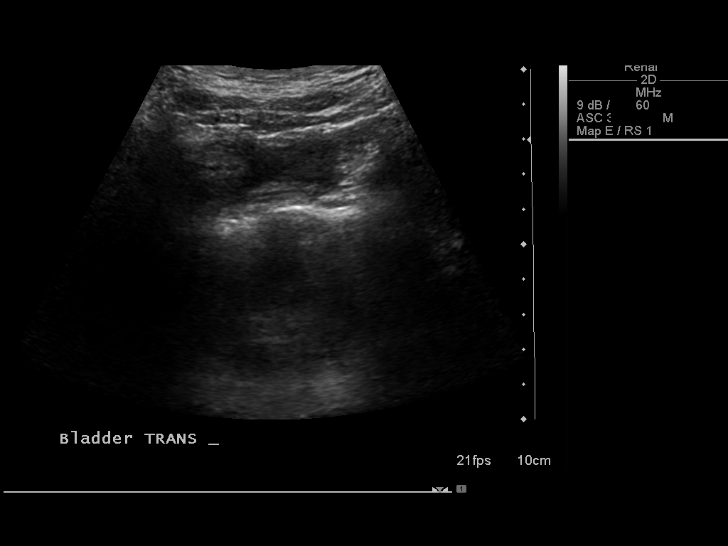
[im 13/23]
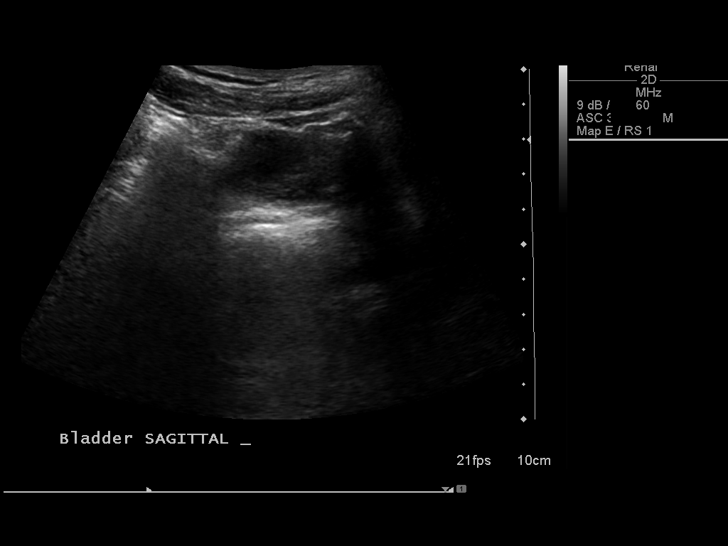
[im 14/23]
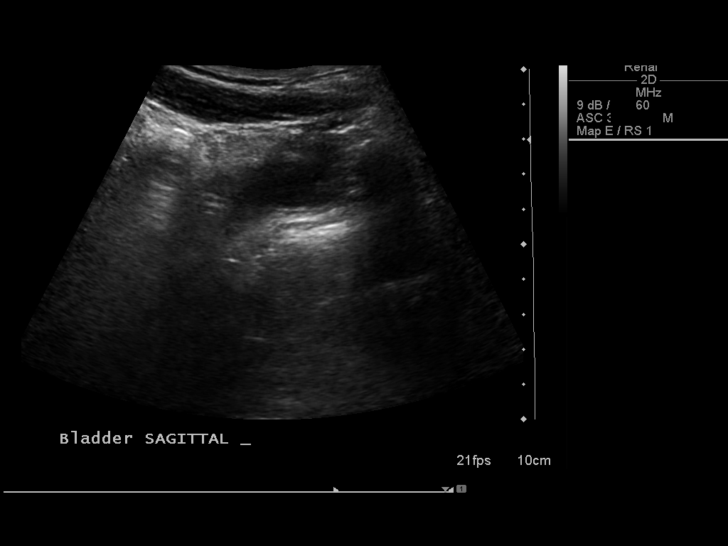
[im 16/23]
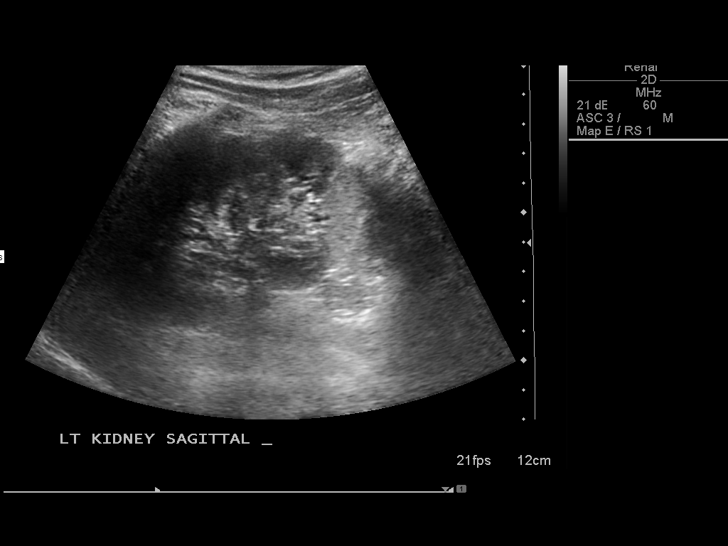
[im 18/23]
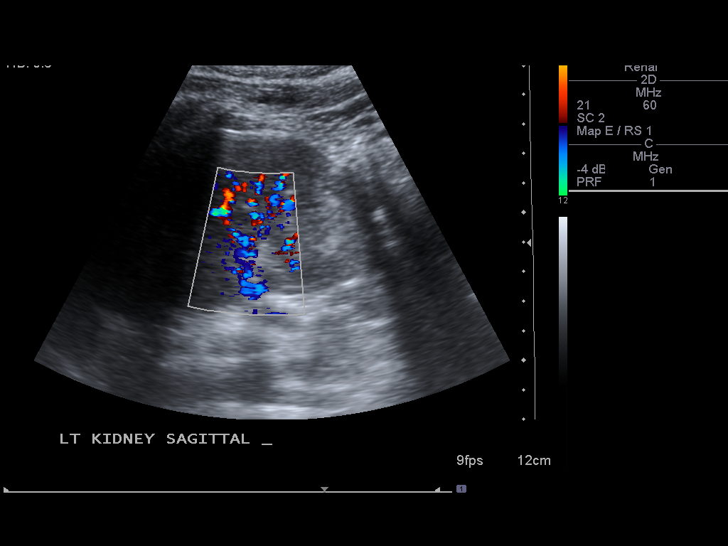
[im 19/23]
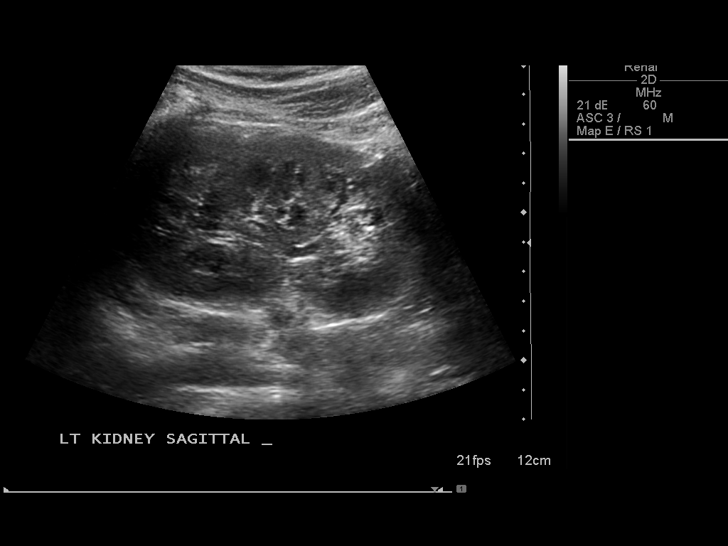
[im 21/23]
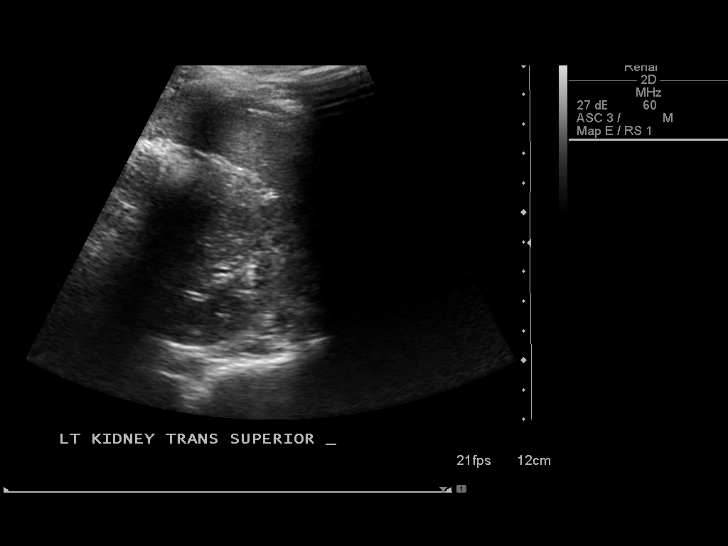
[im 23/23]
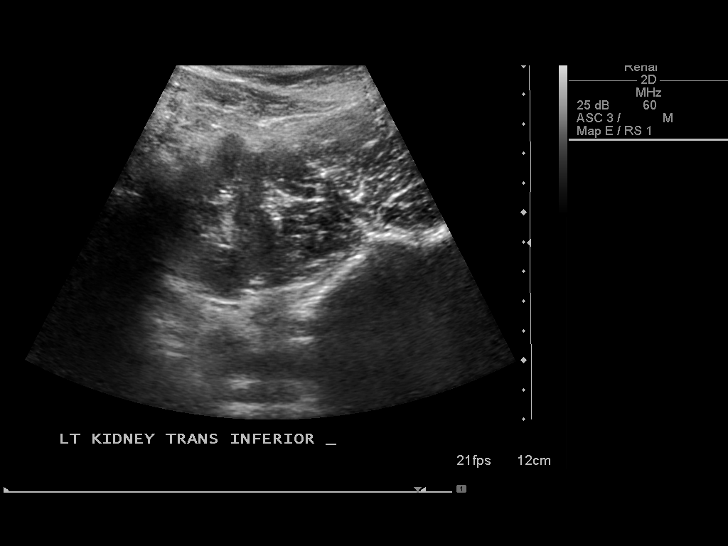

[14 of 23 positions shown; findings below may reference images not displayed]

FINDINGS: Right Kidney:  Right renal length is 10.5 cm.

Left Kidney:  Left renal length is 10.4 cm.

Examination of each kidney shows no evidence of hydronephrosis,
solid or cystic mass, calculus, parenchymal loss, or parenchymal
textural abnormality.

Bladder:  The urinary bladder is incompletely distended.  No
bladder abnormality is demonstrated.
IMPRESSION: No abnormality is identified.

## 2014-07-21 IMAGING — US US PELVIS COMPLETE
1 series · 14 of 25 positions shown · non-contrast
Comparison: None.

CLINICAL DATA: Right adnexal tenderness.

TRANSABDOMINAL AND TRANSVAGINAL ULTRASOUND OF PELVIS
TECHNIQUE: Both transabdominal and transvaginal ultrasound
examinations of the pelvis were performed including evaluation of
the uterus, ovaries, adnexal regions, and pelvic cul-de-sac.

[Series 1: us pelvis complete · 0.24mm/px · 14 of 57 slices shown]
[im 1/57]
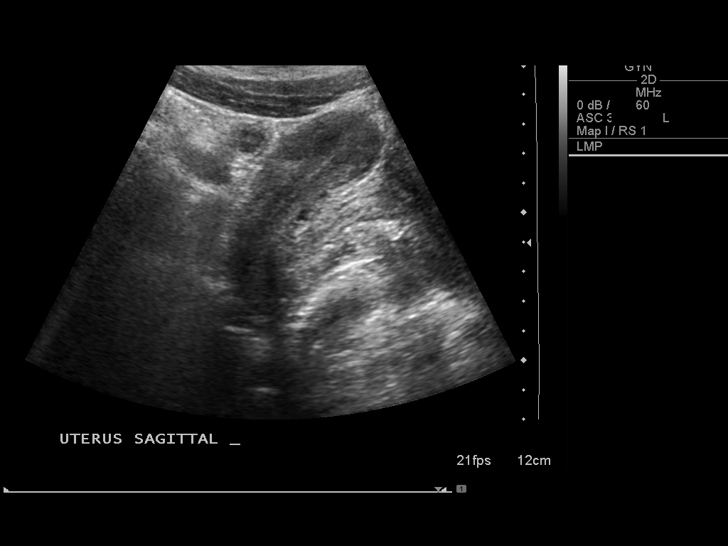
[im 5/57]
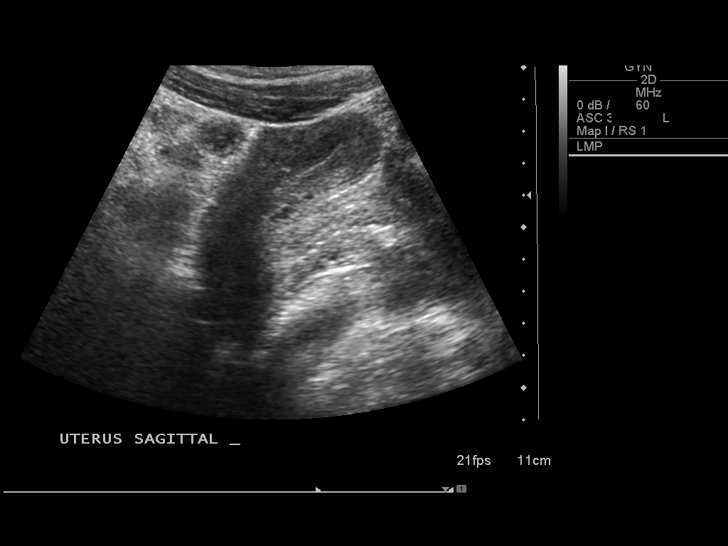
[im 10/57]
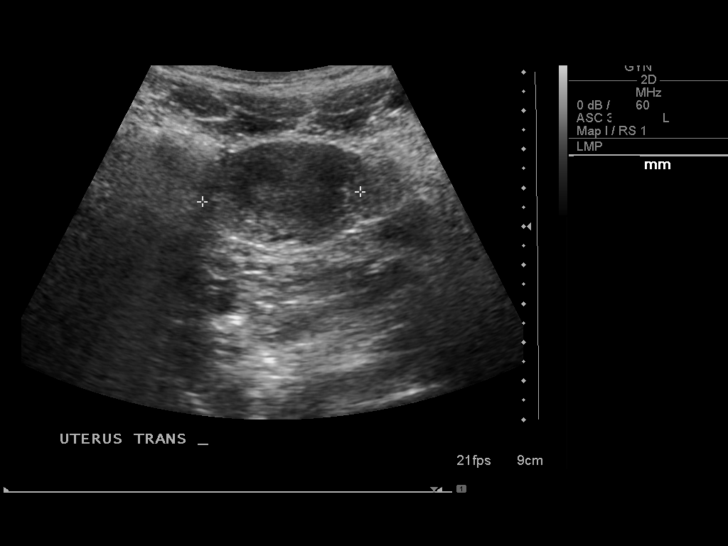
[im 15/57]
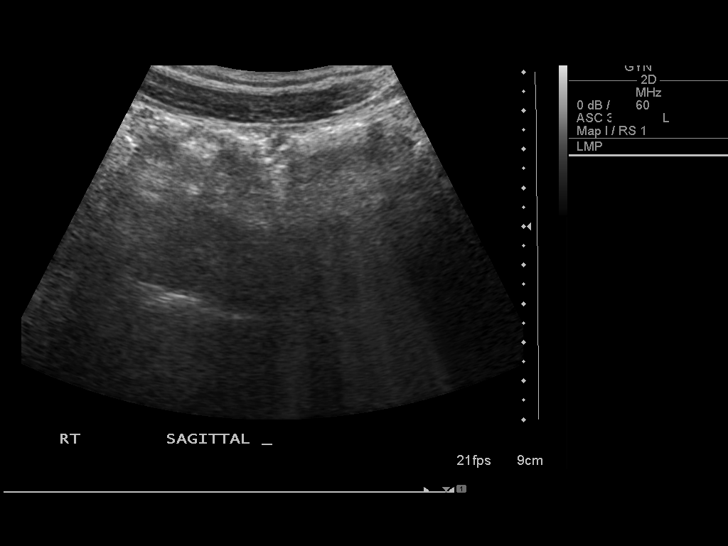
[im 19/57]
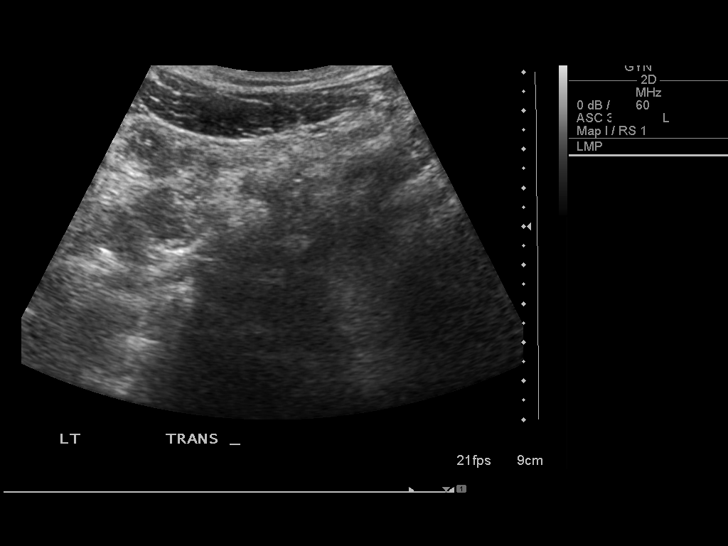
[im 22/57]
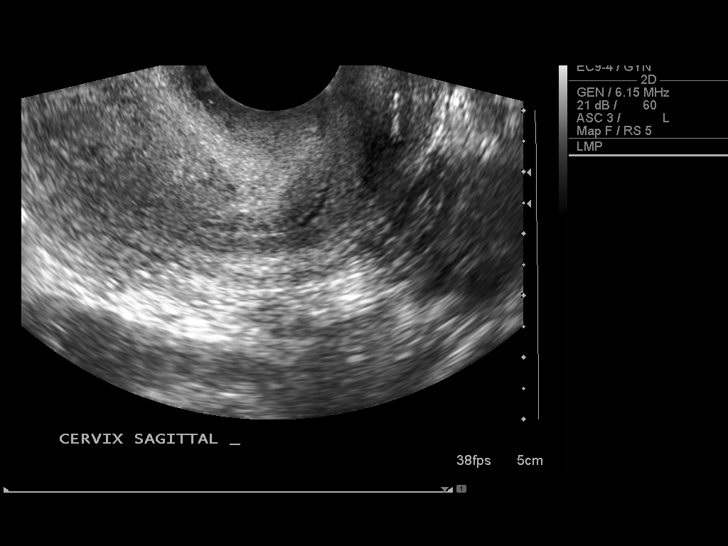
[im 26/57]
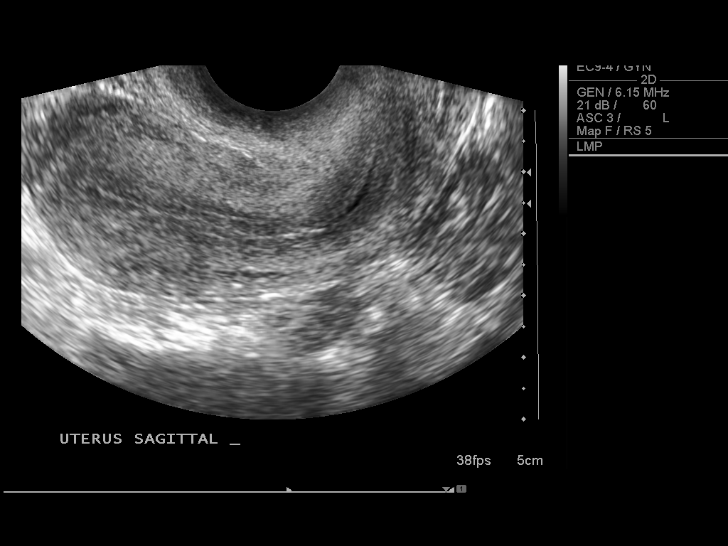
[im 31/57]
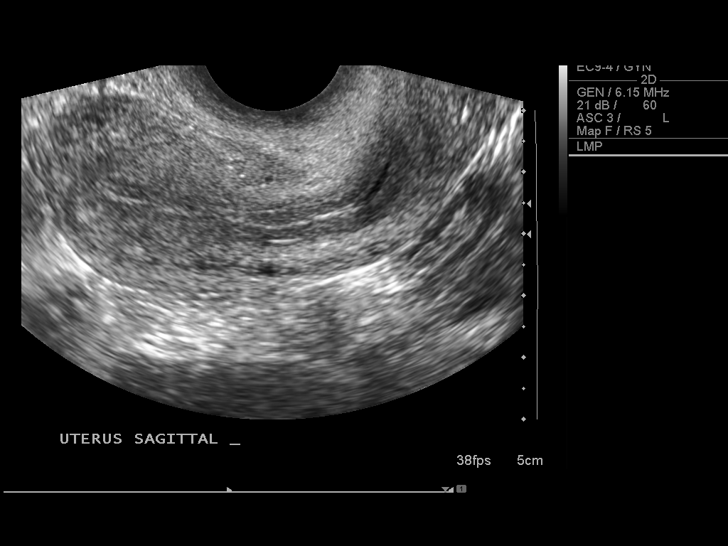
[im 36/57]
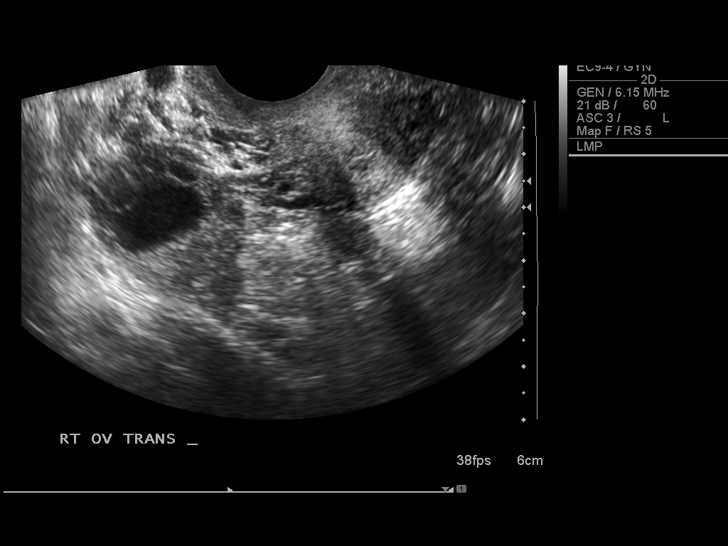
[im 38/57]
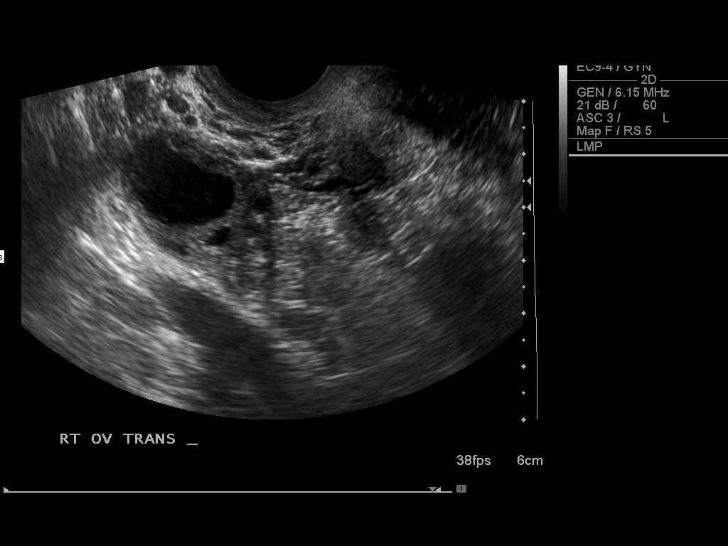
[im 43/57]
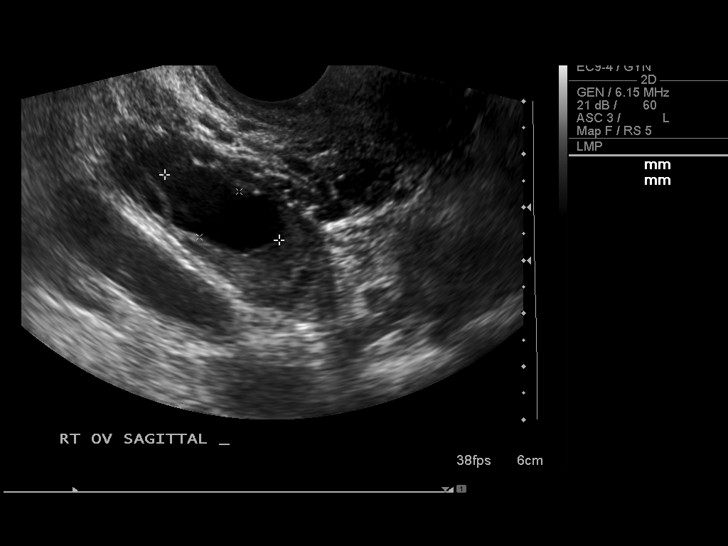
[im 47/57]
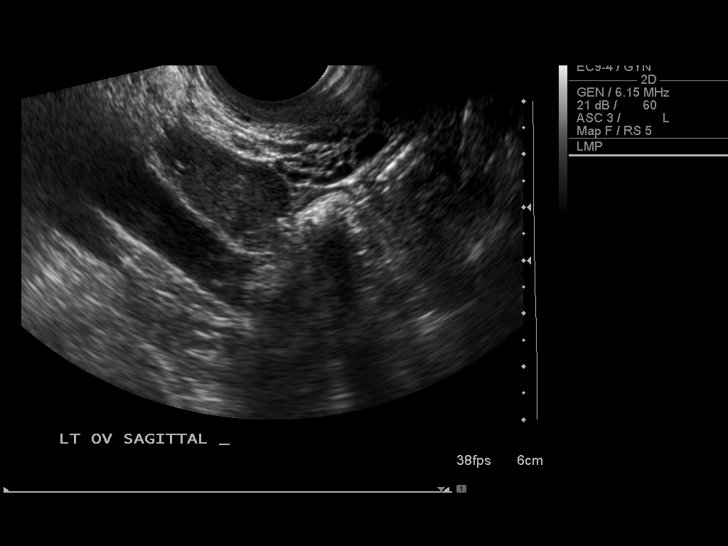
[im 52/57]
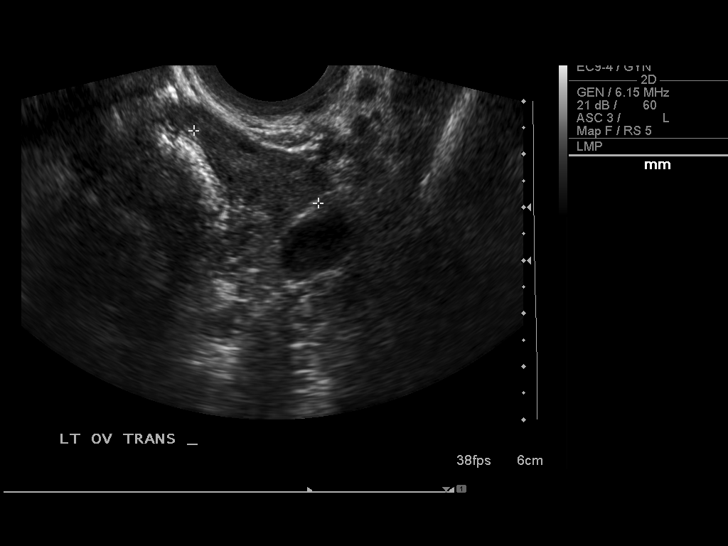
[im 57/57]
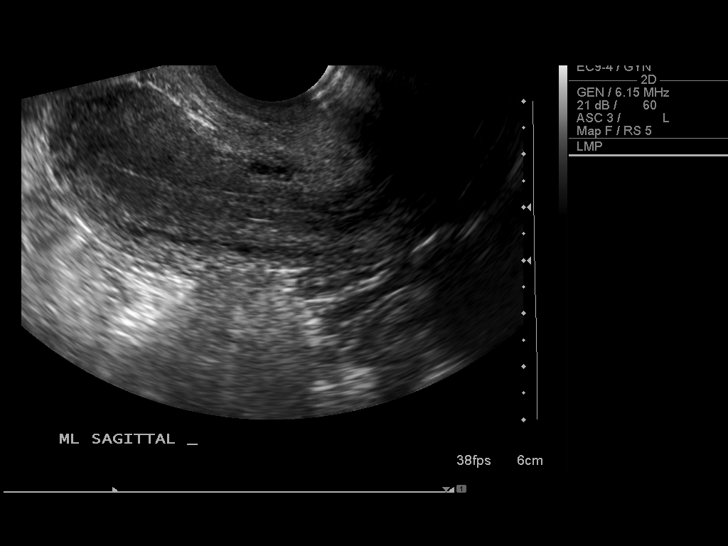

[14 of 25 positions shown; findings below may reference images not displayed]

FINDINGS: Uterus: 6.4 x 2.9 x 3.7 cm.  Normal echotexture.  No focal
abnormality.

Endometrium: Normal appearance and thickness, 4 mm.

Right Ovary: 4.5 x 1.9 x 2.6 cm.  2.5 cm benign cyst or dominant
follicle in the right ovary.

Left Ovary: 2.8 x 1.5 x 2.7 cm. Normal size and echotexture.  No
adnexal masses.

Other Findings:  No free fluid.
IMPRESSION: Small benign cyst or dominant follicle the right ovary.
Unremarkable study.

## 2015-02-21 DIAGNOSIS — Z130 Encounter for screening for diseases of the blood and blood-forming organs and certain disorders involving the immune mechanism: Secondary | ICD-10-CM | POA: Diagnosis not present

## 2015-02-21 DIAGNOSIS — B009 Herpesviral infection, unspecified: Secondary | ICD-10-CM | POA: Diagnosis not present

## 2015-02-21 DIAGNOSIS — Z13220 Encounter for screening for lipoid disorders: Secondary | ICD-10-CM | POA: Diagnosis not present

## 2015-02-21 DIAGNOSIS — Z131 Encounter for screening for diabetes mellitus: Secondary | ICD-10-CM | POA: Diagnosis not present

## 2015-02-21 DIAGNOSIS — Z01419 Encounter for gynecological examination (general) (routine) without abnormal findings: Secondary | ICD-10-CM | POA: Diagnosis not present

## 2015-02-21 DIAGNOSIS — R102 Pelvic and perineal pain: Secondary | ICD-10-CM | POA: Diagnosis not present

## 2015-02-21 DIAGNOSIS — R7309 Other abnormal glucose: Secondary | ICD-10-CM | POA: Diagnosis not present

## 2015-02-21 DIAGNOSIS — E559 Vitamin D deficiency, unspecified: Secondary | ICD-10-CM | POA: Diagnosis not present

## 2015-02-21 DIAGNOSIS — Z3009 Encounter for other general counseling and advice on contraception: Secondary | ICD-10-CM | POA: Diagnosis not present

## 2015-02-21 DIAGNOSIS — Z0000 Encounter for general adult medical examination without abnormal findings: Secondary | ICD-10-CM | POA: Diagnosis not present

## 2015-02-21 DIAGNOSIS — Z113 Encounter for screening for infections with a predominantly sexual mode of transmission: Secondary | ICD-10-CM | POA: Diagnosis not present

## 2015-02-21 DIAGNOSIS — Z124 Encounter for screening for malignant neoplasm of cervix: Secondary | ICD-10-CM | POA: Diagnosis not present

## 2015-03-14 DIAGNOSIS — N289 Disorder of kidney and ureter, unspecified: Secondary | ICD-10-CM | POA: Diagnosis not present

## 2015-05-22 ENCOUNTER — Other Ambulatory Visit (HOSPITAL_COMMUNITY)
Admission: RE | Admit: 2015-05-22 | Discharge: 2015-05-22 | Disposition: A | Discharge status: Home / Self Care | Payer: Medicaid Other | Source: Ambulatory Visit | Attending: Family Medicine | Admitting: Family Medicine

## 2015-05-22 ENCOUNTER — Emergency Department (INDEPENDENT_AMBULATORY_CARE_PROVIDER_SITE_OTHER)
Admission: EM | Admit: 2015-05-22 | Discharge: 2015-05-22 | Disposition: A | Discharge status: Home / Self Care | Payer: Medicaid Other | Source: Home / Self Care

## 2015-05-22 ENCOUNTER — Encounter (HOSPITAL_COMMUNITY): Payer: Self-pay | Admitting: Emergency Medicine

## 2015-05-22 DIAGNOSIS — N760 Acute vaginitis: Secondary | ICD-10-CM | POA: Insufficient documentation

## 2015-05-22 DIAGNOSIS — B3731 Acute candidiasis of vulva and vagina: Secondary | ICD-10-CM

## 2015-05-22 DIAGNOSIS — Z113 Encounter for screening for infections with a predominantly sexual mode of transmission: Secondary | ICD-10-CM | POA: Insufficient documentation

## 2015-05-22 DIAGNOSIS — B373 Candidiasis of vulva and vagina: Secondary | ICD-10-CM | POA: Diagnosis not present

## 2015-05-22 LAB — POCT URINALYSIS DIP (DEVICE)
BILIRUBIN URINE: NEGATIVE
Glucose, UA: NEGATIVE mg/dL
HGB URINE DIPSTICK: NEGATIVE
Ketones, ur: NEGATIVE mg/dL
NITRITE: NEGATIVE
PH: 7.5 (ref 5.0–8.0)
Protein, ur: NEGATIVE mg/dL
SPECIFIC GRAVITY, URINE: 1.015 (ref 1.005–1.030)
Urobilinogen, UA: 0.2 mg/dL (ref 0.0–1.0)

## 2015-05-22 LAB — POCT PREGNANCY, URINE: Preg Test, Ur: NEGATIVE

## 2015-05-22 MED ORDER — FLUCONAZOLE 200 MG PO TABS: ORAL_TABLET | ORAL | Status: DC

## 2015-05-22 MED ORDER — ALIGN 4 MG PO CAPS: 1.0000 | ORAL_CAPSULE | Freq: Every day | ORAL | Status: DC

## 2015-05-22 NOTE — Discharge Instructions (Signed)
Take probiotic daily for 3 months.  You may use an over the counter one for feminine health if you prefer.  Yogurt daily.  No soaps or douching on vaginal tissue.   Vaginitis Vaginitis is an inflammation of the vagina. It is most often caused by a change in the normal balance of the bacteria and yeast that live in the vagina. This change in balance causes an overgrowth of certain bacteria or yeast, which causes the inflammation. There are different types of vaginitis, but the most common types are:  Bacterial vaginosis.  Yeast infection (candidiasis).  Trichomoniasis vaginitis. This is a sexually transmitted infection (STI).  Viral vaginitis.  Atrophic vaginitis.  Allergic vaginitis. CAUSES  The cause depends on the type of vaginitis. Vaginitis can be caused by:  Bacteria (bacterial vaginosis).  Yeast (yeast infection).  A parasite (trichomoniasis vaginitis)  A virus (viral vaginitis).  Low hormone levels (atrophic vaginitis). Low hormone levels can occur during pregnancy, breastfeeding, or after menopause.  Irritants, such as bubble baths, scented tampons, and feminine sprays (allergic vaginitis). Other factors can change the normal balance of the yeast and bacteria that live in the vagina. These include:  Antibiotic medicines.  Poor hygiene.  Diaphragms, vaginal sponges, spermicides, birth control pills, and intrauterine devices (IUD).  Sexual intercourse.  Infection.  Uncontrolled diabetes.  A weakened immune system. SYMPTOMS  Symptoms can vary depending on the cause of the vaginitis. Common symptoms include:  Abnormal vaginal discharge.  The discharge is white, gray, or yellow with bacterial vaginosis.  The discharge is thick, white, and cheesy with a yeast infection.  The discharge is frothy and yellow or greenish with trichomoniasis.  A bad vaginal odor.  The odor is fishy with bacterial vaginosis.  Vaginal itching, pain, or swelling.  Painful  intercourse.  Pain or burning when urinating. Sometimes, there are no symptoms. TREATMENT  Treatment will vary depending on the type of infection.   Bacterial vaginosis and trichomoniasis are often treated with antibiotic creams or pills.  Yeast infections are often treated with antifungal medicines, such as vaginal creams or suppositories.  Viral vaginitis has no cure, but symptoms can be treated with medicines that relieve discomfort. Your sexual partner should be treated as well.  Atrophic vaginitis may be treated with an estrogen cream, pill, suppository, or vaginal ring. If vaginal dryness occurs, lubricants and moisturizing creams may help. You may be told to avoid scented soaps, sprays, or douches.  Allergic vaginitis treatment involves quitting the use of the product that is causing the problem. Vaginal creams can be used to treat the symptoms. HOME CARE INSTRUCTIONS   Take all medicines as directed by your caregiver.  Keep your genital area clean and dry. Avoid soap and only rinse the area with water.  Avoid douching. It can remove the healthy bacteria in the vagina.  Do not use tampons or have sexual intercourse until your vaginitis has been treated. Use sanitary pads while you have vaginitis.  Wipe from front to back. This avoids the spread of bacteria from the rectum to the vagina.  Let air reach your genital area.  Wear cotton underwear to decrease moisture buildup.  Avoid wearing underwear while you sleep until your vaginitis is gone.  Avoid tight pants and underwear or nylons without a cotton panel.  Take off wet clothing (especially bathing suits) as soon as possible.  Use mild, non-scented products. Avoid using irritants, such as:  Scented feminine sprays.  Fabric softeners.  Scented detergents.  Scented tampons.  Scented soaps or bubble baths.  Practice safe sex and use condoms. Condoms may prevent the spread of trichomoniasis and viral  vaginitis. SEEK MEDICAL CARE IF:   You have abdominal pain.  You have a fever or persistent symptoms for more than 2-3 days.  You have a fever and your symptoms suddenly get worse.   This information is not intended to replace advice given to you by your health care provider. Make sure you discuss any questions you have with your health care provider.   Document Released: 02/11/2007 Document Revised: 08/31/2014 Document Reviewed: 09/27/2011 Elsevier Interactive Patient Education [employer].

## 2015-05-22 NOTE — ED Provider Notes (Signed)
CSN: [id number]     Arrival date & time 05/22/15  1311 History   None    Chief Complaint  Patient presents with  . Vaginal Discharge   (Consider location/radiation/quality/duration/timing/severity/associated sxs/prior Treatment)  HPI   She is a 25 year old female presenting today with complaints of a vaginal discharge that started yesterday but is "worse today. Patient reports a creamy, thick white "chunky" discharge with some vaginal itching and burning. Denies any history of recent antibiotic use or use of soaps or douching. Denies significant medical history. States she is sexually active but not using birth control for personal beliefs.  History reviewed. No pertinent past medical history. History reviewed. No pertinent past surgical history. Family History  Problem Relation Age of Onset  . Heart attack Maternal Aunt   . Hypertension Maternal Aunt   . Hypertension Maternal Grandfather    Social History  Substance Use Topics  . Smoking status: Never Smoker   . Smokeless tobacco: None  . Alcohol Use: Yes     Comment: occasional   OB History    No data available     Review of Systems  Constitutional: Negative.  Negative for fever and chills.  HENT: Negative.  Negative for sinus pressure, sneezing and sore throat.   Eyes: Negative.   Respiratory: Negative.  Negative for wheezing.   Cardiovascular: Negative.   Gastrointestinal: Negative.  Negative for nausea, vomiting, abdominal pain and diarrhea.  Endocrine: Negative.   Genitourinary: Positive for vaginal discharge. Negative for frequency, decreased urine volume, genital sores and pelvic pain.       See HPI.   Musculoskeletal: Negative.   Skin: Negative.  Negative for pallor and rash.  Allergic/Immunologic: Negative.  Negative for environmental allergies, food allergies and immunocompromised state.  Neurological: Negative.   Hematological: Negative.   Psychiatric/Behavioral: Negative.     Allergies  Review of  patient's allergies indicates no known allergies.  Home Medications   Prior to Admission medications   Medication Sig Start Date End Date Taking? Authorizing Provider  fluconazole (DIFLUCAN) 200 MG tablet Take one 200 mg tablet today and one 200 mg tablet three days later. 05/22/15   Nehemiah Settle, NP  metroNIDAZOLE (FLAGYL) 500 MG tablet Take 1 tablet (500 mg total) by mouth 2 (two) times daily. 06/14/14   Gregor Hams, MD  Multiple Vitamin (MULTIVITAMIN WITH MINERALS) TABS Take 1 tablet by mouth daily.    Historical Provider, MD  Omega-3 Fatty Acids (OMEGA 3 PO) Take 1 capsule by mouth daily.    Historical Provider, MD  Probiotic Product (ALIGN) 4 MG CAPS Take 1 capsule (4 mg total) by mouth daily. 05/22/15   Nehemiah Settle, NP   Meds Ordered and Administered this Visit  Medications - No data to display  BP 99/69 mmHg  Pulse 66  Temp(Src) 97.8 F (36.6 C) (Oral)  Resp 18  SpO2 100%  LMP 05/08/2015 No data found.   Physical Exam  Constitutional: She is oriented to person, place, and time. She appears well-developed and well-nourished. No distress.  Cardiovascular: Normal rate, regular rhythm, normal heart sounds and intact distal pulses.  Exam reveals no gallop and no friction rub.   No murmur heard. Pulmonary/Chest: Effort normal and breath sounds normal. No respiratory distress. She has no wheezes. She has no rales. She exhibits no tenderness.  Abdominal: Soft. Bowel sounds are normal. She exhibits no distension and no mass. There is no tenderness. There is no rebound and no guarding.  Genitourinary: Uterus normal. Vaginal  discharge found.  Thick copious white vaginal discharge noted on external genitalia and in vagina.  No breaks in skin or bleeding.  No sores, cervix non-friable. Negative CMT and adnexa unremarkable.   Neurological: She is alert and oriented to person, place, and time.  Skin: Skin is warm and dry. She is not diaphoretic.  Nursing note and vitals  reviewed.   ED Course  Procedures (including critical care time)  Labs Review Labs Reviewed  POCT URINALYSIS DIP (DEVICE) - Abnormal; Notable for the following:    Leukocytes, UA TRACE (*)    All other components within normal limits  RPR  HIV ANTIBODY (ROUTINE TESTING)  POCT PREGNANCY, URINE  CERVICOVAGINAL ANCILLARY ONLY   Results for orders placed or performed during the hospital encounter of 05/22/15  POCT urinalysis dip (device)  Result Value Ref Range   Glucose, UA NEGATIVE NEGATIVE mg/dL   Bilirubin Urine NEGATIVE NEGATIVE   Ketones, ur NEGATIVE NEGATIVE mg/dL   Specific Gravity, Urine 1.015 1.005 - 1.030   Hgb urine dipstick NEGATIVE NEGATIVE   pH 7.5 5.0 - 8.0   Protein, ur NEGATIVE NEGATIVE mg/dL   Urobilinogen, UA 0.2 0.0 - 1.0 mg/dL   Nitrite NEGATIVE NEGATIVE   Leukocytes, UA TRACE (A) NEGATIVE  Pregnancy, urine POC  Result Value Ref Range   Preg Test, Ur NEGATIVE NEGATIVE    Imaging Review No results found.  Patient requests STD testing, including RPR and HIV.  Results Pending.    MDM   1. Candida vaginitis    Meds ordered this encounter  Medications  . Probiotic Product (ALIGN) 4 MG CAPS    Sig: Take 1 capsule (4 mg total) by mouth daily.    Dispense:  30 capsule    Refill:  2  . fluconazole (DIFLUCAN) 200 MG tablet    Sig: Take one 200 mg tablet today and one 200 mg tablet three days later.    Dispense:  2 tablet    Refill:  0   The patient advised to use OTC monistat cream on external genitalia for relief of burning and itching PRN next week.  The patient verbalizes understanding and agrees to plan of care.  Nehemiah Settle, NP 05/22/15 1534

## 2015-05-22 NOTE — ED Notes (Signed)
Call back number verified.

## 2015-05-22 NOTE — ED Notes (Signed)
C/o white/creamy vag d/c onset yest associated w/irritation Denies abd pain, urinary sx, fevers  A&O x4... No acute distress.

## 2015-05-23 LAB — CERVICOVAGINAL ANCILLARY ONLY
CHLAMYDIA, DNA PROBE: NEGATIVE
Neisseria Gonorrhea: NEGATIVE
Wet Prep (BD Affirm): POSITIVE — AB

## 2015-05-23 LAB — RPR: RPR Ser Ql: NONREACTIVE

## 2015-05-23 LAB — HIV ANTIBODY (ROUTINE TESTING W REFLEX): HIV SCREEN 4TH GENERATION: NONREACTIVE

## 2015-05-27 ENCOUNTER — Telehealth (HOSPITAL_COMMUNITY): Payer: Self-pay | Admitting: Emergency Medicine

## 2015-05-27 NOTE — ED Notes (Signed)
Called pt and notified her of recent lab results from visit 1/22 Pt ID'd properly... Reports she's feeling better Pt is Pos for Candida... Diflucan given at visit... Pt reports she's feeling better  Per Dr. Valere Dross,  Please let patient know that HIV/gonorrhea/chlamydia tests were negative.  Please let patient know that rpr (syphilis test) was non reactive (no syphilis).   Pt given education on safe sex and proper hgyiene Adv pt if sx are not getting better to return  Pt verb understanding.

## 2015-07-25 DIAGNOSIS — Z113 Encounter for screening for infections with a predominantly sexual mode of transmission: Secondary | ICD-10-CM | POA: Diagnosis not present

## 2015-07-25 DIAGNOSIS — B373 Candidiasis of vulva and vagina: Secondary | ICD-10-CM | POA: Diagnosis not present

## 2015-09-26 ENCOUNTER — Ambulatory Visit (HOSPITAL_COMMUNITY)
Admission: EM | Admit: 2015-09-26 | Discharge: 2015-09-26 | Disposition: A | Discharge status: Home / Self Care | Payer: Medicare Other | Attending: Family Medicine | Admitting: Family Medicine

## 2015-09-26 ENCOUNTER — Encounter (HOSPITAL_COMMUNITY): Payer: Self-pay | Admitting: *Deleted

## 2015-09-26 DIAGNOSIS — R51 Headache: Secondary | ICD-10-CM | POA: Diagnosis not present

## 2015-09-26 DIAGNOSIS — J029 Acute pharyngitis, unspecified: Secondary | ICD-10-CM | POA: Diagnosis not present

## 2015-09-26 LAB — POCT RAPID STREP A: Streptococcus, Group A Screen (Direct): NEGATIVE

## 2015-09-26 MED ORDER — AZITHROMYCIN 250 MG PO TABS: ORAL_TABLET | ORAL | Status: DC

## 2015-09-26 NOTE — ED Provider Notes (Signed)
CSN: [id number]     Arrival date & time 09/26/15  1411 History   First MD Initiated Contact with Patient 09/26/15 1516     Chief Complaint  Patient presents with  . Sore Throat   (Consider location/radiation/quality/duration/timing/severity/associated sxs/prior Treatment) Patient is a 25 y.o. female presenting with pharyngitis. The history is provided by the patient.  Sore Throat This is a new problem. The current episode started more than 1 week ago (onset on 12th which improved butrelapsed yest., no fever., no known exposure.). The problem has been gradually worsening. Associated symptoms include headaches. Pertinent negatives include no chest pain and no abdominal pain. The symptoms are aggravated by swallowing.    History reviewed. No pertinent past medical history. History reviewed. No pertinent past surgical history. Family History  Problem Relation Age of Onset  . Heart attack Maternal Aunt   . Hypertension Maternal Aunt   . Hypertension Maternal Grandfather    Social History  Substance Use Topics  . Smoking status: Never Smoker   . Smokeless tobacco: None  . Alcohol Use: Yes     Comment: occasional   OB History    No data available     Review of Systems  Constitutional: Negative.  Negative for fever.  HENT: Positive for sore throat. Negative for congestion and postnasal drip.   Respiratory: Negative.   Cardiovascular: Negative.  Negative for chest pain.  Gastrointestinal: Negative.  Negative for abdominal pain.  Neurological: Positive for headaches.  All other systems reviewed and are negative.   Allergies  Review of patient's allergies indicates no known allergies.  Home Medications   Prior to Admission medications   Medication Sig Start Date End Date Taking? Authorizing Provider  azithromycin (ZITHROMAX Z-PAK) 250 MG tablet Take as directed on pack 09/26/15   Billy Fischer, MD  fluconazole (DIFLUCAN) 200 MG tablet Take one 200 mg tablet today and one 200 mg  tablet three days later. 05/22/15   Nehemiah Settle, NP  metroNIDAZOLE (FLAGYL) 500 MG tablet Take 1 tablet (500 mg total) by mouth 2 (two) times daily. 06/14/14   Gregor Hams, MD  Multiple Vitamin (MULTIVITAMIN WITH MINERALS) TABS Take 1 tablet by mouth daily.    Historical Provider, MD  Omega-3 Fatty Acids (OMEGA 3 PO) Take 1 capsule by mouth daily.    Historical Provider, MD  Probiotic Product (ALIGN) 4 MG CAPS Take 1 capsule (4 mg total) by mouth daily. 05/22/15   Nehemiah Settle, NP   Meds Ordered and Administered this Visit  Medications - No data to display  BP 116/75 mmHg  Pulse 99  Temp(Src) 99.9 F (37.7 C) (Oral)  Resp 12  SpO2 100%  LMP 09/24/2015 No data found.   Physical Exam  Constitutional: She is oriented to person, place, and time. She appears well-developed and well-nourished. No distress.  HENT:  Left Ear: External ear normal.  Mouth/Throat: Oropharynx is clear and moist.  Eyes: Conjunctivae are normal. Pupils are equal, round, and reactive to light.  Neck: Normal range of motion. Neck supple.  Cardiovascular: Normal rate, normal heart sounds and intact distal pulses.   Pulmonary/Chest: Effort normal and breath sounds normal.  Lymphadenopathy:    She has no cervical adenopathy.  Neurological: She is alert and oriented to person, place, and time.  Skin: Skin is warm and dry.  Nursing note and vitals reviewed.   ED Course  Procedures (including critical care time)  Labs Review Labs Reviewed  CULTURE, GROUP A STREP [facility])  POCT RAPID STREP A  strep neg.  Imaging Review No results found.   Visual Acuity Review  Right Eye Distance:   Left Eye Distance:   Bilateral Distance:    Right Eye Near:   Left Eye Near:    Bilateral Near:         MDM   1. Acute pharyngitis, unspecified etiology        Billy Fischer, MD 09/26/15 2039

## 2015-09-26 NOTE — ED Notes (Signed)
Pt reports   Had  A  sortethroat  About  12  Days  Ago     Got  Better  On its own      Came  Back  Yesterday

## 2015-09-26 NOTE — Discharge Instructions (Signed)
Drink lots of fluids, take all of medicine, use lozenges as needed.return if needed °

## 2015-09-29 LAB — CULTURE, GROUP A STREP (THRC)

## 2015-11-02 ENCOUNTER — Encounter (HOSPITAL_COMMUNITY): Payer: Self-pay | Admitting: Family Medicine

## 2015-11-02 ENCOUNTER — Ambulatory Visit (HOSPITAL_COMMUNITY)
Admission: EM | Admit: 2015-11-02 | Discharge: 2015-11-02 | Disposition: A | Discharge status: Home / Self Care | Payer: Medicare Other | Attending: Emergency Medicine | Admitting: Emergency Medicine

## 2015-11-02 DIAGNOSIS — N760 Acute vaginitis: Secondary | ICD-10-CM | POA: Diagnosis not present

## 2015-11-02 MED ORDER — FLUCONAZOLE 150 MG PO TABS: 150.0000 mg | ORAL_TABLET | Freq: Every day | ORAL | Status: DC

## 2015-11-02 MED ORDER — METRONIDAZOLE 500 MG PO TABS: 500.0000 mg | ORAL_TABLET | Freq: Two times a day (BID) | ORAL | Status: DC

## 2015-11-02 NOTE — ED Notes (Signed)
Pt here for thick white vaginal discharge with foul odor. Denies pain or itching. sts since the 1st and no OTC meds

## 2015-11-02 NOTE — Discharge Instructions (Signed)
Start Flagyl as directed. Also take Diflucan as directed. No alcohol or sexual intercourse until finishing all medication. Follow-up in 7 days if not resolving.    Vaginitis Vaginitis is an inflammation of the vagina. It is most often caused by a change in the normal balance of the bacteria and yeast that live in the vagina. This change in balance causes an overgrowth of certain bacteria or yeast, which causes the inflammation. There are different types of vaginitis, but the most common types are:  Bacterial vaginosis.  Yeast infection (candidiasis).  Trichomoniasis vaginitis. This is a sexually transmitted infection (STI).  Viral vaginitis.  Atrophic vaginitis.  Allergic vaginitis. CAUSES  The cause depends on the type of vaginitis. Vaginitis can be caused by:  Bacteria (bacterial vaginosis).  Yeast (yeast infection).  A parasite (trichomoniasis vaginitis)  A virus (viral vaginitis).  Low hormone levels (atrophic vaginitis). Low hormone levels can occur during pregnancy, breastfeeding, or after menopause.  Irritants, such as bubble baths, scented tampons, and feminine sprays (allergic vaginitis). Other factors can change the normal balance of the yeast and bacteria that live in the vagina. These include:  Antibiotic medicines.  Poor hygiene.  Diaphragms, vaginal sponges, spermicides, birth control pills, and intrauterine devices (IUD).  Sexual intercourse.  Infection.  Uncontrolled diabetes.  A weakened immune system. SYMPTOMS  Symptoms can vary depending on the cause of the vaginitis. Common symptoms include:  Abnormal vaginal discharge.  The discharge is white, gray, or yellow with bacterial vaginosis.  The discharge is thick, white, and cheesy with a yeast infection.  The discharge is frothy and yellow or greenish with trichomoniasis.  A bad vaginal odor.  The odor is fishy with bacterial vaginosis.  Vaginal itching, pain, or swelling.  Painful  intercourse.  Pain or burning when urinating. Sometimes, there are no symptoms. TREATMENT  Treatment will vary depending on the type of infection.   Bacterial vaginosis and trichomoniasis are often treated with antibiotic creams or pills.  Yeast infections are often treated with antifungal medicines, such as vaginal creams or suppositories.  Viral vaginitis has no cure, but symptoms can be treated with medicines that relieve discomfort. Your sexual partner should be treated as well.  Atrophic vaginitis may be treated with an estrogen cream, pill, suppository, or vaginal ring. If vaginal dryness occurs, lubricants and moisturizing creams may help. You may be told to avoid scented soaps, sprays, or douches.  Allergic vaginitis treatment involves quitting the use of the product that is causing the problem. Vaginal creams can be used to treat the symptoms. HOME CARE INSTRUCTIONS   Take all medicines as directed by your caregiver.  Keep your genital area clean and dry. Avoid soap and only rinse the area with water.  Avoid douching. It can remove the healthy bacteria in the vagina.  Do not use tampons or have sexual intercourse until your vaginitis has been treated. Use sanitary pads while you have vaginitis.  Wipe from front to back. This avoids the spread of bacteria from the rectum to the vagina.  Let air reach your genital area.  Wear cotton underwear to decrease moisture buildup.  Avoid wearing underwear while you sleep until your vaginitis is gone.  Avoid tight pants and underwear or nylons without a cotton panel.  Take off wet clothing (especially bathing suits) as soon as possible.  Use mild, non-scented products. Avoid using irritants, such as:  Scented feminine sprays.  Fabric softeners.  Scented detergents.  Scented tampons.  Scented soaps or bubble baths.  Practice safe sex and use condoms. Condoms may prevent the spread of trichomoniasis and viral  vaginitis. SEEK MEDICAL CARE IF:   You have abdominal pain.  You have a fever or persistent symptoms for more than 2-3 days.  You have a fever and your symptoms suddenly get worse.   This information is not intended to replace advice given to you by your health care provider. Make sure you discuss any questions you have with your health care provider.   Document Released: 02/11/2007 Document Revised: 08/31/2014 Document Reviewed: 09/27/2011 Elsevier Interactive Patient Education 2016 Elsevier Inc.  Bacterial Vaginosis Bacterial vaginosis is a vaginal infection that occurs when the normal balance of bacteria in the vagina is disrupted. It results from an overgrowth of certain bacteria. This is the most common vaginal infection in women of childbearing age. Treatment is important to prevent complications, especially in pregnant women, as it can cause a premature delivery. CAUSES  Bacterial vaginosis is caused by an increase in harmful bacteria that are normally present in smaller amounts in the vagina. Several different kinds of bacteria can cause bacterial vaginosis. However, the reason that the condition develops is not fully understood. RISK FACTORS Certain activities or behaviors can put you at an increased risk of developing bacterial vaginosis, including:  Having a new sex partner or multiple sex partners.  Douching.  Using an intrauterine device (IUD) for contraception. Women do not get bacterial vaginosis from toilet seats, bedding, swimming pools, or contact with objects around them. SIGNS AND SYMPTOMS  Some women with bacterial vaginosis have no signs or symptoms. Common symptoms include:  Grey vaginal discharge.  A fishlike odor with discharge, especially after sexual intercourse.  Itching or burning of the vagina and vulva.  Burning or pain with urination. DIAGNOSIS  Your health care provider will take a medical history and examine the vagina for signs of bacterial  vaginosis. A sample of vaginal fluid may be taken. Your health care provider will look at this sample under a microscope to check for bacteria and abnormal cells. A vaginal pH test may also be done.  TREATMENT  Bacterial vaginosis may be treated with antibiotic medicines. These may be given in the form of a pill or a vaginal cream. A second round of antibiotics may be prescribed if the condition comes back after treatment. Because bacterial vaginosis increases your risk for sexually transmitted diseases, getting treated can help reduce your risk for chlamydia, gonorrhea, HIV, and herpes. HOME CARE INSTRUCTIONS   Only take over-the-counter or prescription medicines as directed by your health care provider.  If antibiotic medicine was prescribed, take it as directed. Make sure you finish it even if you start to feel better.  Tell all sexual partners that you have a vaginal infection. They should see their health care provider and be treated if they have problems, such as a mild rash or itching.  During treatment, it is important that you follow these instructions:  Avoid sexual activity or use condoms correctly.  Do not douche.  Avoid alcohol as directed by your health care provider.  Avoid breastfeeding as directed by your health care provider. SEEK MEDICAL CARE IF:   Your symptoms are not improving after 3 days of treatment.  You have increased discharge or pain.  You have a fever. MAKE SURE YOU:   Understand these instructions.  Will watch your condition.  Will get help right away if you are not doing well or get worse. FOR MORE INFORMATION  Centers for Disease  Control and Prevention, Division of STD Prevention: [employer] American Sexual Health Association (ASHA): www.ashastd.org    This information is not intended to replace advice given to you by your health care provider. Make sure you discuss any questions you have with your health care provider.   Document  Released: 04/16/2005 Document Revised: 05/07/2014 Document Reviewed: 11/26/2012 Elsevier Interactive Patient Education [employer].

## 2015-11-02 NOTE — ED Provider Notes (Signed)
CSN: [id number]     Arrival date & time 11/02/15  1008 History   First MD Initiated Contact with Patient 11/02/15 1114     Chief Complaint  Patient presents with  . Vaginitis   (Consider location/radiation/quality/duration/timing/severity/associated sxs/prior Treatment) HPI Comments: Patient presents with unusual white vaginal discharge that has a bad odor for the past 5 days. Minimal itching. No pain. Is sexually active. Last activity about 1 week ago. Uses condoms. Has had unusual discharge before but usually itches. No previous STI's.   The history is provided by the patient.    History reviewed. No pertinent past medical history. History reviewed. No pertinent past surgical history. Family History  Problem Relation Age of Onset  . Heart attack Maternal Aunt   . Hypertension Maternal Aunt   . Hypertension Maternal Grandfather    Social History  Substance Use Topics  . Smoking status: Never Smoker   . Smokeless tobacco: None  . Alcohol Use: Yes     Comment: occasional   OB History    No data available     Review of Systems  Genitourinary: Positive for vaginal discharge. Negative for dysuria and vaginal bleeding.  Skin: Negative for rash.    Allergies  Review of patient's allergies indicates no known allergies.  Home Medications   Prior to Admission medications   Medication Sig Start Date End Date Taking? Authorizing Provider  azithromycin (ZITHROMAX Z-PAK) 250 MG tablet Take as directed on pack 09/26/15   Billy Fischer, MD  fluconazole (DIFLUCAN) 150 MG tablet Take 1 tablet (150 mg total) by mouth daily. May repeat in 3 days. 11/02/15   Katy Apo, NP  metroNIDAZOLE (FLAGYL) 500 MG tablet Take 1 tablet (500 mg total) by mouth 2 (two) times daily. No alcohol. Take with food 11/02/15   Katy Apo, NP  Multiple Vitamin (MULTIVITAMIN WITH MINERALS) TABS Take 1 tablet by mouth daily.    Historical Provider, MD  Omega-3 Fatty Acids (OMEGA 3 PO) Take 1 capsule by mouth  daily.    Historical Provider, MD  Probiotic Product (ALIGN) 4 MG CAPS Take 1 capsule (4 mg total) by mouth daily. 05/22/15   Nehemiah Settle, NP   Meds Ordered and Administered this Visit  Medications - No data to display  BP 104/69 mmHg  Pulse 60  Temp(Src) 97.9 F (36.6 C)  Resp 18  SpO2 100%  LMP 10/20/2015 No data found.   Physical Exam  Constitutional: She is oriented to person, place, and time. She appears well-developed and well-nourished.  Abdominal: Soft. Bowel sounds are normal. She exhibits no mass. There is no tenderness. There is no rebound and no guarding.  Genitourinary: Uterus normal. There is no rash on the right labia. There is no rash on the left labia. Cervix exhibits no motion tenderness and no friability. Right adnexum displays no mass and no tenderness. Left adnexum displays no mass and no tenderness. No erythema, tenderness or bleeding in the vagina. Vaginal discharge (white thin discharge with odor) found.  Lymphadenopathy:       Right: No inguinal adenopathy present.       Left: No inguinal adenopathy present.  Neurological: She is alert and oriented to person, place, and time.  Skin: Skin is warm and dry.    ED Course  Procedures (including critical care time)  Labs Review Labs Reviewed  CERVICOVAGINAL ANCILLARY ONLY    Imaging Review No results found.   Visual Acuity Review  Right Eye Distance:  Left Eye Distance:   Bilateral Distance:    Right Eye Near:   Left Eye Near:    Bilateral Near:         MDM   1. Vaginitis   Pelvic exam performed. Cervical/Vaginal specimens collected for vaginitis screen and GC/Chlamydia. Discussed probable diagnosis of Bacterial Vaginitis based upon history and physical findings. Recommend Flagyl 500mg  as directed. No alcohol while on medication. Take Diflucan 150mg  once today, may repeat in 3 days if needed. No sexual activity for 1 week. Follow-up pending lab results or with primary care provider if  symptoms do not resolve.  Please note- patient is not on Zithromax (was given 6 weeks ago) and unable to remove from history so if positive for Chlamydia- patient will need to be treated.     Katy Apo, NP 11/03/15 [phone]

## 2015-11-03 LAB — CERVICOVAGINAL ANCILLARY ONLY
Chlamydia: NEGATIVE
Neisseria Gonorrhea: NEGATIVE
WET PREP (BD AFFIRM): POSITIVE — AB

## 2015-11-09 ENCOUNTER — Ambulatory Visit (HOSPITAL_COMMUNITY)
Admission: EM | Admit: 2015-11-09 | Discharge: 2015-11-09 | Disposition: A | Discharge status: Home / Self Care | Payer: Medicare Other | Attending: Family Medicine | Admitting: Family Medicine

## 2015-11-09 ENCOUNTER — Encounter (HOSPITAL_COMMUNITY): Payer: Self-pay | Admitting: Emergency Medicine

## 2015-11-09 DIAGNOSIS — K143 Hypertrophy of tongue papillae: Secondary | ICD-10-CM | POA: Diagnosis not present

## 2015-11-09 NOTE — ED Notes (Signed)
Patient concerned for discoloration of tongue.  Dark patches on back of tongue.  Patient noticed this for one week

## 2015-11-09 NOTE — Discharge Instructions (Signed)
Scrub as instructed, see dentist if further concerns.

## 2015-11-09 NOTE — ED Provider Notes (Signed)
CSN: [id number]     Arrival date & time 11/09/15  1206 History   First MD Initiated Contact with Patient 11/09/15 1237     Chief Complaint  Patient presents with  . Mouth Lesions   (Consider location/radiation/quality/duration/timing/severity/associated sxs/prior Treatment) Patient is a 25 y.o. female presenting with mouth sores. The history is provided by the patient.  Mouth Lesions Location:  Tongue Quality:  Black Onset quality:  Gradual Severity:  Mild Duration:  1 week Progression:  Unchanged Chronicity:  New Relieved by:  None tried Worsened by:  Nothing tried Ineffective treatments:  None tried Associated symptoms: no dental pain     History reviewed. No pertinent past medical history. History reviewed. No pertinent past surgical history. Family History  Problem Relation Age of Onset  . Heart attack Maternal Aunt   . Hypertension Maternal Aunt   . Hypertension Maternal Grandfather    Social History  Substance Use Topics  . Smoking status: Never Smoker   . Smokeless tobacco: None  . Alcohol Use: Yes     Comment: occasional   OB History    No data available     Review of Systems  Constitutional: Negative.   HENT: Positive for mouth sores. Negative for nosebleeds.     Allergies  Review of patient's allergies indicates no known allergies.  Home Medications   Prior to Admission medications   Medication Sig Start Date End Date Taking? Authorizing Provider  azithromycin (ZITHROMAX Z-PAK) 250 MG tablet Take as directed on pack Patient not taking: Reported on 11/09/2015 09/26/15   Billy Fischer, MD  fluconazole (DIFLUCAN) 150 MG tablet Take 1 tablet (150 mg total) by mouth daily. May repeat in 3 days. Patient not taking: Reported on 11/09/2015 11/02/15   Katy Apo, NP  metroNIDAZOLE (FLAGYL) 500 MG tablet Take 1 tablet (500 mg total) by mouth 2 (two) times daily. No alcohol. Take with food Patient not taking: Reported on 11/09/2015 11/02/15   Katy Apo, NP   Multiple Vitamin (MULTIVITAMIN WITH MINERALS) TABS Take 1 tablet by mouth daily.    Historical Provider, MD  Omega-3 Fatty Acids (OMEGA 3 PO) Take 1 capsule by mouth daily.    Historical Provider, MD  Probiotic Product (ALIGN) 4 MG CAPS Take 1 capsule (4 mg total) by mouth daily. 05/22/15   Nehemiah Settle, NP   Meds Ordered and Administered this Visit  Medications - No data to display  BP 108/65 mmHg  Pulse 74  Temp(Src) 99.1 F (37.3 C) (Oral)  Resp 16  SpO2 100%  LMP 10/20/2015 No data found.   Physical Exam  Constitutional: She appears well-developed and well-nourished.  HENT:  Right Ear: External ear normal.  Left Ear: External ear normal.  Nose: Nose normal.  Mouth/Throat: Mucous membranes are normal.    Nursing note and vitals reviewed.   ED Course  Procedures (including critical care time)  Labs Review Labs Reviewed - No data to display  Imaging Review No results found.   Visual Acuity Review  Right Eye Distance:   Left Eye Distance:   Bilateral Distance:    Right Eye Near:   Left Eye Near:    Bilateral Near:         MDM   1. Black hairy tongue        Billy Fischer, MD 11/09/15 1258

## 2015-12-25 ENCOUNTER — Encounter (HOSPITAL_COMMUNITY): Payer: Self-pay | Admitting: Emergency Medicine

## 2015-12-25 ENCOUNTER — Ambulatory Visit (HOSPITAL_COMMUNITY)
Admission: EM | Admit: 2015-12-25 | Discharge: 2015-12-25 | Disposition: A | Discharge status: Home / Self Care | Payer: Medicare Other | Attending: Nurse Practitioner | Admitting: Nurse Practitioner

## 2015-12-25 DIAGNOSIS — B9689 Other specified bacterial agents as the cause of diseases classified elsewhere: Secondary | ICD-10-CM

## 2015-12-25 DIAGNOSIS — N760 Acute vaginitis: Secondary | ICD-10-CM | POA: Insufficient documentation

## 2015-12-25 DIAGNOSIS — Z8249 Family history of ischemic heart disease and other diseases of the circulatory system: Secondary | ICD-10-CM | POA: Diagnosis not present

## 2015-12-25 DIAGNOSIS — N898 Other specified noninflammatory disorders of vagina: Secondary | ICD-10-CM | POA: Diagnosis present

## 2015-12-25 DIAGNOSIS — A499 Bacterial infection, unspecified: Secondary | ICD-10-CM

## 2015-12-25 LAB — POCT URINALYSIS DIP (DEVICE)
Bilirubin Urine: NEGATIVE
Glucose, UA: NEGATIVE mg/dL
Hgb urine dipstick: NEGATIVE
Ketones, ur: NEGATIVE mg/dL
Leukocytes, UA: NEGATIVE
Nitrite: NEGATIVE
Protein, ur: NEGATIVE mg/dL
Specific Gravity, Urine: >=1.03 (ref 1.005–1.030)
Urobilinogen, UA: 0.2 mg/dL (ref 0.0–1.0)
pH: 5.5 (ref 5.0–8.0)

## 2015-12-25 LAB — POCT PREGNANCY, URINE: Preg Test, Ur: NEGATIVE

## 2015-12-25 MED ORDER — METRONIDAZOLE 500 MG PO TABS: 500.0000 mg | ORAL_TABLET | Freq: Two times a day (BID) | ORAL | 0 refills | Status: DC

## 2015-12-25 NOTE — ED Triage Notes (Signed)
The patient presented to the [facility] with a complaint of a vaginal discharge and odor x 4 days. The patient denied any dysuria or urinary frequency.

## 2015-12-25 NOTE — ED Provider Notes (Signed)
CSN: [id number]     Arrival date & time 12/25/15  1203 History   First MD Initiated Contact with Patient 12/25/15 1307     Chief Complaint  Patient presents with  . Vaginal Discharge   (Consider location/radiation/quality/duration/timing/severity/associated sxs/prior Treatment) Patient is no longer sexually active, last intercourse was on 12/12/15. Started noticing symptoms 4 days ago. Patient denies vaginal itchiness, denies vaginal irritation. Endorses vaginal dischrage. She declines STD testing, was tested for STD 2 weeks ago and was negative for HIV, syphilis, GC, and Chlamydia.    The history is provided by the patient.  Vaginal Discharge  Quality:  Thick (creamy color) Severity:  Mild Onset quality:  Sudden Duration:  4 days Timing:  Constant Progression:  Worsening Chronicity:  Recurrent Associated symptoms: no abdominal pain, no dysuria, no fever, no nausea and no vomiting     History reviewed. No pertinent past medical history. History reviewed. No pertinent surgical history. Family History  Problem Relation Age of Onset  . Heart attack Maternal Aunt   . Hypertension Maternal Aunt   . Hypertension Maternal Grandfather    Social History  Substance Use Topics  . Smoking status: Never Smoker  . Smokeless tobacco: Never Used  . Alcohol use Yes     Comment: occasional   OB History    No data available     Review of Systems  Constitutional: Negative for chills, fatigue and fever.  Respiratory: Negative for cough and shortness of breath.   Cardiovascular: Negative for chest pain and palpitations.  Gastrointestinal: Negative for abdominal pain, diarrhea, nausea and vomiting.  Genitourinary: Positive for vaginal discharge. Negative for dysuria, vaginal bleeding and vaginal pain.    Allergies  Review of patient's allergies indicates no known allergies.  Home Medications   Prior to Admission medications   Medication Sig Start Date End Date Taking? Authorizing  Provider  Multiple Vitamin (MULTIVITAMIN WITH MINERALS) TABS Take 1 tablet by mouth daily.   Yes Historical Provider, MD  azithromycin (ZITHROMAX Z-PAK) 250 MG tablet Take as directed on pack Patient not taking: Reported on 11/09/2015 09/26/15   Billy Fischer, MD  fluconazole (DIFLUCAN) 150 MG tablet Take 1 tablet (150 mg total) by mouth daily. May repeat in 3 days. Patient not taking: Reported on 11/09/2015 11/02/15   Katy Apo, NP  metroNIDAZOLE (FLAGYL) 500 MG tablet Take 1 tablet (500 mg total) by mouth 2 (two) times daily. 12/25/15   Barry Dienes, NP  Omega-3 Fatty Acids (OMEGA 3 PO) Take 1 capsule by mouth daily.    Historical Provider, MD  Probiotic Product (ALIGN) 4 MG CAPS Take 1 capsule (4 mg total) by mouth daily. 05/22/15   Nehemiah Settle, NP   Meds Ordered and Administered this Visit  Medications - No data to display  BP 104/73 (BP Location: Left Arm)   Pulse 80   Temp 98.5 F (36.9 C) (Oral)   Resp 16   Ht 5\' 4"  (1.626 m)   Wt 137 lb 8 oz (62.4 kg)   LMP 12/17/2015 (Exact Date)   SpO2 100%   BMI 23.60 kg/m  No data found.   Physical Exam  Constitutional: She is oriented to person, place, and time. She appears well-developed and well-nourished.  HENT:  Head: Normocephalic and atraumatic.  Cardiovascular: Normal rate, regular rhythm and normal heart sounds.   Pulmonary/Chest: Effort normal and breath sounds normal. No respiratory distress.  Abdominal: Soft. Bowel sounds are normal. There is no tenderness.  Genitourinary:  Genitourinary Comments:  Labia majora and labia minora unremarkable with no lesions. Vaginal canal pink and moist with no lesion. Moderate amount of white, thin, non-odorous discharge noted. Negative CMT.   Neurological: She is alert and oriented to person, place, and time.  Skin: Skin is warm.  Psychiatric: She has a normal mood and affect.    Urgent Care Course   Clinical Course    Procedures (including critical care time)  Labs  Review Labs Reviewed  POCT URINALYSIS DIP (DEVICE)  POCT PREGNANCY, URINE  CYTOLOGY, (ORAL, ANAL, URETHRAL) ANCILLARY ONLY    Imaging Review No results found.      MDM   1. BV (bacterial vaginosis)    Physical exam unremarkable except for white thin vaginal discharge. Will treat presumptively for BV with Flagly 500mg  BID x 7 days. Cytology is pending. Will call patient with results. All questions were answered. Discharge instruction given. Instructed to f/u with PCP or return if she does not improve.    Barry Dienes, NP 12/25/15 1357

## 2015-12-27 LAB — CYTOLOGY, (ORAL, ANAL, URETHRAL) ANCILLARY ONLY: Wet Prep (BD Affirm): POSITIVE — AB

## 2015-12-31 ENCOUNTER — Telehealth (HOSPITAL_COMMUNITY): Payer: Self-pay | Admitting: Emergency Medicine

## 2015-12-31 NOTE — Telephone Encounter (Signed)
Called pt and notified of recent lab results from visit 8/27 Pt ID'd properly... Reports she is still having the vag d/c Adv pt if sx are not getting better to return or to f/u w/PCP (preferrably her OBGYN) Will finish Flagyl and wait 3 more days she says.  Education on safe sex given Pt verb understanding.

## 2015-12-31 NOTE — Telephone Encounter (Signed)
-----   Message from Barry Dienes, NP sent at 12/28/2015 10:12 AM EDT ----- Clinical staff, Please let patient know that her specimen came back positive for bacterial vaginosis, She was given Flagyl 500mg  BID x 7 days, which is appropriate. Please f/u with her PCP if she does not improve. Thanks

## 2016-01-16 DIAGNOSIS — N898 Other specified noninflammatory disorders of vagina: Secondary | ICD-10-CM | POA: Diagnosis not present

## 2016-01-29 ENCOUNTER — Encounter (HOSPITAL_COMMUNITY): Payer: Self-pay | Admitting: *Deleted

## 2016-01-29 ENCOUNTER — Ambulatory Visit (HOSPITAL_COMMUNITY)
Admission: EM | Admit: 2016-01-29 | Discharge: 2016-01-29 | Disposition: A | Discharge status: Home / Self Care | Payer: Medicare Other | Attending: Radiology | Admitting: Radiology

## 2016-01-29 DIAGNOSIS — Z79899 Other long term (current) drug therapy: Secondary | ICD-10-CM | POA: Insufficient documentation

## 2016-01-29 DIAGNOSIS — N898 Other specified noninflammatory disorders of vagina: Secondary | ICD-10-CM | POA: Insufficient documentation

## 2016-01-29 DIAGNOSIS — N760 Acute vaginitis: Secondary | ICD-10-CM

## 2016-01-29 HISTORY — DX: Other specified bacterial agents as the cause of diseases classified elsewhere: N76.0

## 2016-01-29 HISTORY — DX: Other specified bacterial agents as the cause of diseases classified elsewhere: B96.89

## 2016-01-29 MED ORDER — METRONIDAZOLE 500 MG PO TABS: 500.0000 mg | ORAL_TABLET | Freq: Two times a day (BID) | ORAL | 0 refills | Status: DC

## 2016-01-29 MED ORDER — FLUTICASONE PROPIONATE 50 MCG/ACT NA SUSP: 1.0000 | Freq: Every day | NASAL | 2 refills | Status: DC

## 2016-01-29 MED ORDER — FLUCONAZOLE 150 MG PO TABS: 150.0000 mg | ORAL_TABLET | Freq: Every day | ORAL | 0 refills | Status: DC

## 2016-01-29 NOTE — ED Triage Notes (Signed)
C/O vaginal irritation and thick, white vaginal discharge without odor x 6 days.  Had course of abx for BV 1 mo ago.  Denies any urinary sxs.  Has tried sea salt soak.

## 2016-01-29 NOTE — ED Provider Notes (Signed)
CSN: [id number]     Arrival date & time 01/29/16  1157 History   None    Chief Complaint  Patient presents with  . Vaginal Discharge   (Consider location/radiation/quality/duration/timing/severity/associated sxs/prior Treatment) 25 y.o. female presents with vaginal discharge  X approximately 1 month. Condition is acute in nature. Condition is made better by nothing. Condition is made worse by nothing. . Patient states that she was seen at this facility approximately 1 month ago and was diagnosed with BV. Patient states that she completed her course of antibiotics but the symptoms never went away entirely. Patient describes her symptoms as vaginal discharge that is white and thick and odorless. Patient is sexually active and denies a vaginal exam at this time.    Patient is also complaining a pressure to her left ear intermittently      Past Medical History:  Diagnosis Date  . Bacterial vaginosis    History reviewed. No pertinent surgical history. Family History  Problem Relation Age of Onset  . Heart attack Maternal Aunt   . Hypertension Maternal Aunt   . Hypertension Maternal Grandfather    Social History  Substance Use Topics  . Smoking status: Never Smoker  . Smokeless tobacco: Never Used  . Alcohol use Yes     Comment: occasional   OB History    No data available     Review of Systems  Constitutional: Negative.   HENT: Negative.   Respiratory: Negative.   Gastrointestinal: Negative.   Genitourinary: Positive for vaginal discharge (white, thick and odorless). Negative for difficulty urinating, dyspareunia, dysuria and menstrual problem.    Allergies  Review of patient's allergies indicates no known allergies.  Home Medications   Prior to Admission medications   Medication Sig Start Date End Date Taking? Authorizing Provider  azithromycin (ZITHROMAX Z-PAK) 250 MG tablet Take as directed on pack Patient not taking: Reported on 11/09/2015 09/26/15   Billy Fischer,  MD  fluconazole (DIFLUCAN) 150 MG tablet Take 1 tablet (150 mg total) by mouth daily. May repeat in 3 days. 01/29/16   Jacqualine Mau, NP  fluticasone (FLONASE) 50 MCG/ACT nasal spray Place 1 spray into both nostrils daily. 01/29/16   Jacqualine Mau, NP  metroNIDAZOLE (FLAGYL) 500 MG tablet Take 1 tablet (500 mg total) by mouth 2 (two) times daily. 01/29/16   Jacqualine Mau, NP  Multiple Vitamin (MULTIVITAMIN WITH MINERALS) TABS Take 1 tablet by mouth daily.    Historical Provider, MD  Omega-3 Fatty Acids (OMEGA 3 PO) Take 1 capsule by mouth daily.    Historical Provider, MD  Probiotic Product (ALIGN) 4 MG CAPS Take 1 capsule (4 mg total) by mouth daily. 05/22/15   Nehemiah Settle, NP   Meds Ordered and Administered this Visit  Medications - No data to display  BP 113/66 (BP Location: Left Arm)   Pulse 76   Temp 98.5 F (36.9 C) (Oral)   Resp 20   LMP 01/11/2016 (Exact Date)   SpO2 100%  No data found.   Physical Exam  Constitutional: She is oriented to person, place, and time. She appears well-developed and well-nourished.  HENT:  Head: Normocephalic and atraumatic.  Eyes: Conjunctivae are normal.  Neck: Normal range of motion.  Pulmonary/Chest: Effort normal.  Neurological: She is alert and oriented to person, place, and time.  Skin: Skin is dry.  Psychiatric: She has a normal mood and affect.  Nursing note and vitals reviewed.   Urgent Care Course   Clinical  Course    Procedures (including critical care time)  Labs Review Labs Reviewed  URINE CYTOLOGY ANCILLARY ONLY    Imaging Review No results found.   Visual Acuity Review  Right Eye Distance:   Left Eye Distance:   Bilateral Distance:    Right Eye Near:   Left Eye Near:    Bilateral Near:         MDM   1. Acute vaginitis        Jacqualine Mau, NP 01/29/16 1229    Jacqualine Mau, NP 01/29/16 [phone]

## 2016-02-01 LAB — URINE CYTOLOGY ANCILLARY ONLY
BACTERIAL VAGINITIS: NEGATIVE
CANDIDA VAGINITIS: POSITIVE — AB

## 2016-02-03 ENCOUNTER — Telehealth (HOSPITAL_COMMUNITY): Payer: Self-pay | Admitting: Emergency Medicine

## 2016-02-03 NOTE — Telephone Encounter (Signed)
Pt called needing lab results from visit on 10/1 Pt ID'd properly... Reports not feeling any better... Has not p/u Rx b/c she was waiting for lab results States she will p/u Rx Adv pt if sx are not getting better to return or to f/u w/PCP Pt verb understanding.

## 2016-02-08 DIAGNOSIS — A609 Anogenital herpesviral infection, unspecified: Secondary | ICD-10-CM | POA: Diagnosis not present

## 2016-04-12 ENCOUNTER — Encounter (HOSPITAL_COMMUNITY): Payer: Self-pay | Admitting: Family Medicine

## 2016-04-12 ENCOUNTER — Ambulatory Visit (HOSPITAL_COMMUNITY)
Admission: EM | Admit: 2016-04-12 | Discharge: 2016-04-12 | Disposition: A | Discharge status: Home / Self Care | Payer: Medicare Other | Attending: Family Medicine | Admitting: Family Medicine

## 2016-04-12 DIAGNOSIS — B3731 Acute candidiasis of vulva and vagina: Secondary | ICD-10-CM

## 2016-04-12 DIAGNOSIS — B373 Candidiasis of vulva and vagina: Secondary | ICD-10-CM

## 2016-04-12 MED ORDER — TERCONAZOLE 80 MG VA SUPP: 80.0000 mg | Freq: Every day | VAGINAL | 0 refills | Status: DC

## 2016-04-12 MED ORDER — FLUCONAZOLE 150 MG PO TABS: 150.0000 mg | ORAL_TABLET | Freq: Once | ORAL | 1 refills | Status: AC

## 2016-04-12 NOTE — ED Triage Notes (Signed)
Pt here for thick white discharge with odor and vaginal itching,

## 2016-04-12 NOTE — ED Provider Notes (Signed)
[place]    CSN: [id number] Arrival date & time: 04/12/16  1523     History   Chief Complaint Chief Complaint  Patient presents with  . Vaginal Discharge    HPI Taziyah C Ringley is a 25 y.o. female.   The history is provided by the patient.  Vaginal Discharge  Quality:  Thick and white Severity:  Mild Onset quality:  Gradual Duration:  5 days Progression:  Worsening Chronicity:  Recurrent Relieved by:  None tried Worsened by:  Nothing Ineffective treatments:  None tried Associated symptoms: vaginal itching   Associated symptoms: no abdominal pain, no dysuria, no fever and no genital lesions     Past Medical History:  Diagnosis Date  . Bacterial vaginosis     Patient Active Problem List   Diagnosis Date Noted  . Palpitations 05/04/2014  . Hypokalemia 05/04/2014    History reviewed. No pertinent surgical history.  OB History    No data available       Home Medications    Prior to Admission medications   Not on File    Family History Family History  Problem Relation Age of Onset  . Heart attack Maternal Aunt   . Hypertension Maternal Aunt   . Hypertension Maternal Grandfather     Social History Social History  Substance Use Topics  . Smoking status: Never Smoker  . Smokeless tobacco: Never Used  . Alcohol use Yes     Comment: occasional     Allergies   Patient has no known allergies.   Review of Systems Review of Systems  Constitutional: Negative.  Negative for fever.  Gastrointestinal: Negative.  Negative for abdominal pain.  Genitourinary: Positive for vaginal discharge. Negative for dysuria, vaginal bleeding and vaginal pain.  All other systems reviewed and are negative.    Physical Exam Triage Vital Signs ED Triage Vitals [04/12/16 1543]  Enc Vitals Group     BP 112/70     Pulse Rate 70     Resp 18     Temp 98.4 F (36.9 C)     Temp src      SpO2 100 %     Weight      Height      Head  Circumference      Peak Flow      Pain Score      Pain Loc      Pain Edu?      Excl. in [place]?    No data found.   Updated Vital Signs BP 112/70   Pulse 70   Temp 98.4 F (36.9 C)   Resp 18   LMP 03/24/2016   SpO2 100%   Visual Acuity Right Eye Distance:   Left Eye Distance:   Bilateral Distance:    Right Eye Near:   Left Eye Near:    Bilateral Near:     Physical Exam  Constitutional: She is oriented to person, place, and time. She appears well-developed and well-nourished.  Abdominal: Soft. Bowel sounds are normal. She exhibits no mass. There is no tenderness. There is no rebound and no guarding. No hernia.  Neurological: She is alert and oriented to person, place, and time.  Skin: Skin is warm and dry.  Nursing note and vitals reviewed.    UC Treatments / Results  Labs (all labs ordered are listed, but only abnormal results are displayed) Labs Reviewed - No data to display  EKG  EKG Interpretation None  Radiology No results found.  Procedures Procedures (including critical care time)  Medications Ordered in UC Medications - No data to display   Initial Impression / Assessment and Plan / UC Course  I have reviewed the triage vital signs and the nursing notes.  Pertinent labs & imaging results that were available during my care of the patient were reviewed by me and considered in my medical decision making (see chart for details).  Clinical Course       Final Clinical Impressions(s) / UC Diagnoses   Final diagnoses:  None    New Prescriptions New Prescriptions   No medications on file     Billy Fischer, MD 04/12/16 1810

## 2016-04-16 DIAGNOSIS — Z113 Encounter for screening for infections with a predominantly sexual mode of transmission: Secondary | ICD-10-CM | POA: Diagnosis not present

## 2016-04-16 DIAGNOSIS — N3000 Acute cystitis without hematuria: Secondary | ICD-10-CM | POA: Diagnosis not present

## 2016-04-16 DIAGNOSIS — N898 Other specified noninflammatory disorders of vagina: Secondary | ICD-10-CM | POA: Diagnosis not present

## 2016-06-25 DIAGNOSIS — Z202 Contact with and (suspected) exposure to infections with a predominantly sexual mode of transmission: Secondary | ICD-10-CM | POA: Diagnosis not present

## 2016-06-25 DIAGNOSIS — N760 Acute vaginitis: Secondary | ICD-10-CM | POA: Diagnosis not present

## 2016-06-25 DIAGNOSIS — Z113 Encounter for screening for infections with a predominantly sexual mode of transmission: Secondary | ICD-10-CM | POA: Diagnosis not present

## 2016-06-25 DIAGNOSIS — B373 Candidiasis of vulva and vagina: Secondary | ICD-10-CM | POA: Diagnosis not present

## 2016-11-02 ENCOUNTER — Ambulatory Visit (HOSPITAL_COMMUNITY)
Admission: EM | Admit: 2016-11-02 | Discharge: 2016-11-02 | Disposition: A | Discharge status: Home / Self Care | Payer: Medicare Other | Attending: Internal Medicine | Admitting: Internal Medicine

## 2016-11-02 ENCOUNTER — Encounter (HOSPITAL_COMMUNITY): Payer: Self-pay | Admitting: Family Medicine

## 2016-11-02 DIAGNOSIS — B9689 Other specified bacterial agents as the cause of diseases classified elsewhere: Secondary | ICD-10-CM | POA: Diagnosis not present

## 2016-11-02 DIAGNOSIS — N898 Other specified noninflammatory disorders of vagina: Secondary | ICD-10-CM | POA: Diagnosis present

## 2016-11-02 DIAGNOSIS — N760 Acute vaginitis: Secondary | ICD-10-CM | POA: Diagnosis not present

## 2016-11-02 MED ORDER — METRONIDAZOLE 500 MG PO TABS: 500.0000 mg | ORAL_TABLET | Freq: Two times a day (BID) | ORAL | 0 refills | Status: DC

## 2016-11-02 NOTE — ED Triage Notes (Signed)
Pt here for vaginal discharge. sts foul odor. Denis any bleeding. Denies urinary symptoms.

## 2016-11-02 NOTE — ED Provider Notes (Signed)
CSN: [id number]     Arrival date & time 11/02/16  1053 History   None    Chief Complaint  Patient presents with  . Vaginal Discharge   (Consider location/radiation/quality/duration/timing/severity/associated sxs/prior Treatment) The history is provided by the patient.  Vaginal Discharge  Quality:  Malodorous and thick Severity:  Mild Onset quality:  Gradual Duration:  3 days Timing:  Constant Progression:  Unchanged Chronicity:  New Context: after urination and at rest   Relieved by:  None tried Worsened by:  Nothing Ineffective treatments:  None tried Associated symptoms: no abdominal pain, no dyspareunia, no dysuria, no fever, no nausea, no rash, no urinary hesitancy, no vaginal itching and no vomiting   Risk factors: unprotected sex   Risk factors: no new sexual partner and no PID     Past Medical History:  Diagnosis Date  . Bacterial vaginosis    History reviewed. No pertinent surgical history. Family History  Problem Relation Age of Onset  . Heart attack Maternal Aunt   . Hypertension Maternal Aunt   . Hypertension Maternal Grandfather    Social History  Substance Use Topics  . Smoking status: Never Smoker  . Smokeless tobacco: Never Used  . Alcohol use Yes     Comment: occasional   OB History    No data available     Review of Systems  Constitutional: Negative for chills and fever.  HENT: Negative.   Respiratory: Negative.   Cardiovascular: Negative.   Gastrointestinal: Negative for abdominal pain, nausea and vomiting.  Genitourinary: Positive for vaginal discharge. Negative for dyspareunia, dysuria, frequency, hesitancy and urgency.  Musculoskeletal: Negative.   Skin: Negative.   Neurological: Negative.     Allergies  Patient has no known allergies.  Home Medications   Prior to Admission medications   Medication Sig Start Date End Date Taking? Authorizing Provider  metroNIDAZOLE (FLAGYL) 500 MG tablet Take 1 tablet (500 mg total) by mouth 2  (two) times daily. 11/02/16   Barnet Glasgow, NP   Meds Ordered and Administered this Visit  Medications - No data to display  BP 125/75   Pulse 65   Temp 98.2 F (36.8 C)   Resp 18   LMP 10/09/2016   SpO2 100%  No data found.   Physical Exam  Constitutional: She is oriented to person, place, and time. She appears well-developed and well-nourished. No distress.  HENT:  Head: Normocephalic and atraumatic.  Right Ear: External ear normal.  Left Ear: External ear normal.  Eyes: Conjunctivae are normal.  Neck: Normal range of motion.  Genitourinary:  Genitourinary Comments: Deferred, urine cytology obtained  Neurological: She is alert and oriented to person, place, and time.  Skin: Skin is warm and dry. Capillary refill takes less than 2 seconds. No rash noted. She is not diaphoretic. No erythema.  Psychiatric: She has a normal mood and affect. Her behavior is normal.  Nursing note and vitals reviewed.   Urgent Care Course     Procedures (including critical care time)  Labs Review Labs Reviewed  URINE CYTOLOGY ANCILLARY ONLY    Imaging Review No results found.   MDM   1. BV (bacterial vaginosis)    Treating presumptively for bacterial vaginosis with metronidazole, testing for yeast, chlamydia, gonorrhea, Trichomonas. If positive we'll notify of results, otherwise follow-up with health Department as needed.    Barnet Glasgow, NP 11/02/16 [phone]

## 2016-11-02 NOTE — Discharge Instructions (Signed)
To treat the bacteria that commonly calls bacterial vaginosis or vaginitis, I have prescribed metronidazole. Take 1 tablet twice a day for 7 days. Do not drink any alcohol while taking this medicine as it can make you very ill.  Your urine will be tested for gonorrhea, chlamydia, BV, yeast, and trichomonas if anything is positive, he'll be notified in 3-5 business days

## 2016-11-05 LAB — URINE CYTOLOGY ANCILLARY ONLY
Chlamydia: NEGATIVE
NEISSERIA GONORRHEA: NEGATIVE
Trichomonas: NEGATIVE

## 2016-11-07 LAB — URINE CYTOLOGY ANCILLARY ONLY: CANDIDA VAGINITIS: NEGATIVE

## 2016-11-17 ENCOUNTER — Ambulatory Visit (HOSPITAL_COMMUNITY)
Admission: EM | Admit: 2016-11-17 | Discharge: 2016-11-17 | Disposition: A | Discharge status: Home / Self Care | Payer: Medicare Other | Attending: Radiology | Admitting: Radiology

## 2016-11-17 DIAGNOSIS — M546 Pain in thoracic spine: Secondary | ICD-10-CM | POA: Diagnosis not present

## 2016-11-17 MED ORDER — IBUPROFEN 600 MG PO TABS: 600.0000 mg | ORAL_TABLET | Freq: Four times a day (QID) | ORAL | 0 refills | Status: DC | PRN

## 2016-11-17 MED ORDER — PREDNISONE 10 MG PO TABS: 40.0000 mg | ORAL_TABLET | Freq: Every day | ORAL | 0 refills | Status: AC

## 2016-11-17 MED ORDER — CYCLOBENZAPRINE HCL 10 MG PO TABS: 10.0000 mg | ORAL_TABLET | Freq: Two times a day (BID) | ORAL | 0 refills | Status: AC | PRN

## 2016-11-17 NOTE — ED Triage Notes (Addendum)
Complains of upper back pain, onset 7/12.  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

## 2016-11-17 NOTE — Discharge Instructions (Signed)
Rest and continue to apply heat to affected area

## 2016-11-17 NOTE — ED Provider Notes (Signed)
CSN: [id number]     Arrival date & time 11/17/16  1207 History   None    Chief Complaint  Patient presents with  . Back Pain   (Consider location/radiation/quality/duration/timing/severity/associated sxs/prior Treatment) 26 y.o. female presents with pain upper midline to upper right scapula intermittently since 7.12.18.Patient reports some radiation of pain done left arm and right shoulder . Condition is acute in nature. Condition is made better by nothing. Condition is made worse with certain movement including putting on cloths and laughting. Patient denies any relief from hot compresses prior to there arrival at this facility. Patient denies any pain with urination, nausea fever or vomitting.        Past Medical History:  Diagnosis Date  . Bacterial vaginosis    No past surgical history on file. Family History  Problem Relation Age of Onset  . Heart attack Maternal Aunt   . Hypertension Maternal Aunt   . Hypertension Maternal Grandfather    Social History  Substance Use Topics  . Smoking status: Never Smoker  . Smokeless tobacco: Never Used  . Alcohol use Yes     Comment: occasional   OB History    No data available     Review of Systems  Constitutional: Negative for chills and fever.  HENT: Negative for ear pain and sore throat.   Eyes: Negative for pain and visual disturbance.  Respiratory: Negative for cough and shortness of breath.   Cardiovascular: Negative for chest pain and palpitations.  Gastrointestinal: Negative for abdominal pain and vomiting.  Genitourinary: Negative for dysuria and hematuria.  Musculoskeletal: Positive for back pain ( intermittently with radiation down left arm and right shoulder). Negative for arthralgias.  Skin: Negative for color change and rash.  Neurological: Negative for seizures and syncope.  All other systems reviewed and are negative.   Allergies  Patient has no known allergies.  Home Medications   Prior to Admission  medications   Medication Sig Start Date End Date Taking? Authorizing Provider  cyclobenzaprine (FLEXERIL) 10 MG tablet Take 1 tablet (10 mg total) by mouth 2 (two) times daily as needed for muscle spasms. 11/17/16 11/24/16  Jacqualine Mau, NP  ibuprofen (ADVIL,MOTRIN) 600 MG tablet Take 1 tablet (600 mg total) by mouth every 6 (six) hours as needed. 11/17/16   Jacqualine Mau, NP  metroNIDAZOLE (FLAGYL) 500 MG tablet Take 1 tablet (500 mg total) by mouth 2 (two) times daily. 11/02/16   Barnet Glasgow, NP  predniSONE (DELTASONE) 10 MG tablet Take 4 tablets (40 mg total) by mouth daily with breakfast. 11/17/16 11/22/16  Jacqualine Mau, NP   Meds Ordered and Administered this Visit  Medications - No data to display  BP 120/81 (BP Location: Left Arm)   Pulse 86   Temp 99.4 F (37.4 C) (Oral)   Resp 18   LMP 11/05/2016   SpO2 100%  No data found.   Physical Exam  Constitutional: She is oriented to person, place, and time. She appears well-developed and well-nourished.  HENT:  Head: Normocephalic and atraumatic.  Eyes: Conjunctivae are normal.  Neck: Normal range of motion.  Pulmonary/Chest: Effort normal.  Musculoskeletal: Normal range of motion.   No pin point tenderness noted. To lower cervical spin or thoracic  Neurological: She is alert and oriented to person, place, and time.  Psychiatric: She has a normal mood and affect.  Nursing note and vitals reviewed.   Urgent Care Course     Procedures (including critical care time)  Labs  Review Labs Reviewed - No data to display  Imaging Review No results found.      MDM   1. Acute right-sided thoracic back pain        Jacqualine Mau, NP 11/17/16 1250

## 2016-12-12 DIAGNOSIS — N760 Acute vaginitis: Secondary | ICD-10-CM | POA: Diagnosis not present

## 2017-02-12 DIAGNOSIS — Z113 Encounter for screening for infections with a predominantly sexual mode of transmission: Secondary | ICD-10-CM | POA: Diagnosis not present

## 2017-02-12 DIAGNOSIS — Z779 Other contact with and (suspected) exposures hazardous to health: Secondary | ICD-10-CM | POA: Diagnosis not present

## 2017-02-12 DIAGNOSIS — Z6830 Body mass index (BMI) 30.0-30.9, adult: Secondary | ICD-10-CM | POA: Diagnosis not present

## 2017-02-12 DIAGNOSIS — Z01419 Encounter for gynecological examination (general) (routine) without abnormal findings: Secondary | ICD-10-CM | POA: Diagnosis not present

## 2017-03-28 DIAGNOSIS — N760 Acute vaginitis: Secondary | ICD-10-CM | POA: Diagnosis not present

## 2017-05-07 DIAGNOSIS — A609 Anogenital herpesviral infection, unspecified: Secondary | ICD-10-CM | POA: Diagnosis not present

## 2017-05-07 DIAGNOSIS — N898 Other specified noninflammatory disorders of vagina: Secondary | ICD-10-CM | POA: Diagnosis not present

## 2017-05-16 DIAGNOSIS — Z202 Contact with and (suspected) exposure to infections with a predominantly sexual mode of transmission: Secondary | ICD-10-CM | POA: Diagnosis not present

## 2017-05-16 DIAGNOSIS — N760 Acute vaginitis: Secondary | ICD-10-CM | POA: Diagnosis not present

## 2017-05-16 DIAGNOSIS — Z118 Encounter for screening for other infectious and parasitic diseases: Secondary | ICD-10-CM | POA: Diagnosis not present

## 2017-05-16 DIAGNOSIS — Z113 Encounter for screening for infections with a predominantly sexual mode of transmission: Secondary | ICD-10-CM | POA: Diagnosis not present

## 2017-05-16 DIAGNOSIS — B373 Candidiasis of vulva and vagina: Secondary | ICD-10-CM | POA: Diagnosis not present

## 2017-09-13 DIAGNOSIS — Z113 Encounter for screening for infections with a predominantly sexual mode of transmission: Secondary | ICD-10-CM | POA: Diagnosis not present

## 2017-09-13 DIAGNOSIS — B373 Candidiasis of vulva and vagina: Secondary | ICD-10-CM | POA: Diagnosis not present

## 2017-09-13 DIAGNOSIS — Z202 Contact with and (suspected) exposure to infections with a predominantly sexual mode of transmission: Secondary | ICD-10-CM | POA: Diagnosis not present

## 2017-09-13 DIAGNOSIS — Z118 Encounter for screening for other infectious and parasitic diseases: Secondary | ICD-10-CM | POA: Diagnosis not present

## 2017-12-02 DIAGNOSIS — N760 Acute vaginitis: Secondary | ICD-10-CM | POA: Diagnosis not present

## 2018-01-04 ENCOUNTER — Encounter (HOSPITAL_COMMUNITY): Payer: Self-pay | Admitting: Emergency Medicine

## 2018-01-04 ENCOUNTER — Ambulatory Visit (HOSPITAL_COMMUNITY)
Admission: EM | Admit: 2018-01-04 | Discharge: 2018-01-04 | Disposition: A | Discharge status: Home / Self Care | Payer: Medicare Other | Attending: Internal Medicine | Admitting: Internal Medicine

## 2018-01-04 ENCOUNTER — Other Ambulatory Visit: Payer: Self-pay

## 2018-01-04 DIAGNOSIS — E876 Hypokalemia: Secondary | ICD-10-CM | POA: Insufficient documentation

## 2018-01-04 DIAGNOSIS — N898 Other specified noninflammatory disorders of vagina: Secondary | ICD-10-CM

## 2018-01-04 DIAGNOSIS — Z8619 Personal history of other infectious and parasitic diseases: Secondary | ICD-10-CM | POA: Insufficient documentation

## 2018-01-04 DIAGNOSIS — L298 Other pruritus: Secondary | ICD-10-CM | POA: Insufficient documentation

## 2018-01-04 MED ORDER — FLUCONAZOLE 150 MG PO TABS: 150.0000 mg | ORAL_TABLET | Freq: Once | ORAL | 0 refills | Status: AC

## 2018-01-04 NOTE — ED Provider Notes (Signed)
[place]    CSN: [id number] Arrival date & time: 01/04/18  1015     History   Chief Complaint Chief Complaint  Patient presents with  . Vaginal Itching    HPI Jennifer Strong is a 27 y.o. female no significant past medical history presenting today for evaluation of vaginal itching.  Patient states that for the past 2 weeks she has had vaginal itching.  Prior to onset she was treated for BV with MetroGel.  States that she started her menstrual cycle and stopped using the MetroGel.  She denies any odor, states that discharge has been minimal.  Denies abdominal pain, pelvic pain.  Denies fever, nausea, vomiting.  Denies urinary symptoms.  Patient declines sexually active.  LMP around 8/13.  HPI  Past Medical History:  Diagnosis Date  . Bacterial vaginosis     Patient Active Problem List   Diagnosis Date Noted  . Palpitations 05/04/2014  . Hypokalemia 05/04/2014    History reviewed. No pertinent surgical history.  OB History   None      Home Medications    Prior to Admission medications   Medication Sig Start Date End Date Taking? Authorizing Provider  fluconazole (DIFLUCAN) 150 MG tablet Take 1 tablet (150 mg total) by mouth once for 1 dose. 01/04/18 01/04/18  , Elesa Hacker, PA-C    Family History Family History  Problem Relation Age of Onset  . Heart attack Maternal Aunt   . Hypertension Maternal Aunt   . Hypertension Maternal Grandfather     Social History Social History   Tobacco Use  . Smoking status: Never Smoker  . Smokeless tobacco: Never Used  Substance Use Topics  . Alcohol use: Yes    Comment: occasional  . Drug use: No     Allergies   Patient has no known allergies.   Review of Systems Review of Systems  Constitutional: Negative for fever.  Respiratory: Negative for shortness of breath.   Cardiovascular: Negative for chest pain.  Gastrointestinal: Negative for abdominal pain, diarrhea, nausea and vomiting.    Genitourinary: Positive for vaginal discharge. Negative for dysuria, flank pain, genital sores, hematuria, menstrual problem, vaginal bleeding and vaginal pain.  Musculoskeletal: Negative for back pain.  Skin: Negative for rash.  Neurological: Negative for dizziness, light-headedness and headaches.     Physical Exam Triage Vital Signs ED Triage Vitals  Enc Vitals Group     BP 01/04/18 1125 113/81     Pulse Rate 01/04/18 1125 76     Resp 01/04/18 1125 16     Temp 01/04/18 1125 99.6 F (37.6 C)     Temp Source 01/04/18 1125 Oral     SpO2 01/04/18 1125 99 %     Weight --      Height --      Head Circumference --      Peak Flow --      Pain Score 01/04/18 1123 0     Pain Loc --      Pain Edu? --      Excl. in [place]? --    No data found.  Updated Vital Signs BP 113/81 (BP Location: Left Arm)   Pulse 76   Temp 99.6 F (37.6 C) (Oral)   Resp 16   LMP 12/10/2017   SpO2 99%   Visual Acuity Right Eye Distance:   Left Eye Distance:   Bilateral Distance:    Right Eye Near:   Left Eye Near:    Bilateral Near:  Physical Exam  Constitutional: She is oriented to person, place, and time. She appears well-developed and well-nourished.  No acute distress  HENT:  Head: Normocephalic and atraumatic.  Nose: Nose normal.  Eyes: Conjunctivae are normal.  Neck: Neck supple.  Cardiovascular: Normal rate.  Pulmonary/Chest: Effort normal. No respiratory distress.  Abdominal: She exhibits no distension.  Genitourinary:  Genitourinary Comments: Normal external female genitalia, mild amount of thick white discharge seen in vaginal vault, cervix nonerythematous, no cervical motion tenderness.  Musculoskeletal: Normal range of motion.  Neurological: She is alert and oriented to person, place, and time.  Skin: Skin is warm and dry.  Psychiatric: She has a normal mood and affect.  Nursing note and vitals reviewed.    UC Treatments / Results  Labs (all labs ordered are listed, but  only abnormal results are displayed) Labs Reviewed  CERVICOVAGINAL ANCILLARY ONLY    EKG None  Radiology No results found.  Procedures Procedures (including critical care time)  Medications Ordered in UC Medications - No data to display  Initial Impression / Assessment and Plan / UC Course  I have reviewed the triage vital signs and the nursing notes.  Pertinent labs & imaging results that were available during my care of the patient were reviewed by me and considered in my medical decision making (see chart for details).     Patient symptoms most likely lining up with yeast infection, will treat with Diflucan, vaginal swab obtained to check for resolution of BV from previous treatment.  Will call patient with results and alter treatment as needed.Discussed strict return precautions. Patient verbalized understanding and is agreeable with plan.  Final Clinical Impressions(s) / UC Diagnoses   Final diagnoses:  Vaginal itching     Discharge Instructions     I have sent in Diflucan for you to to treat yeast infection, please take 1 tablet today, may repeat in a couple of days if still having symptoms.  We are testing you for Gonorrhea, Chlamydia, Trichomonas, Yeast and Bacterial Vaginosis. We will call you if anything is positive and let you know if you require any further treatment. Please inform partners of any positive results.   Please return if symptoms not improving with treatment, development of fever, nausea, vomiting, abdominal pain.    ED Prescriptions    Medication Sig Dispense Auth. Provider   fluconazole (DIFLUCAN) 150 MG tablet Take 1 tablet (150 mg total) by mouth once for 1 dose. 2 tablet ,  C, PA-C     Controlled Substance Prescriptions  Controlled Substance Registry consulted? Not Applicable   Janith Lima, [place] 01/04/18 1202

## 2018-01-04 NOTE — ED Triage Notes (Signed)
Vaginal itching, onset 2 weeks ago.  Minimal vaginal discharge.  Denies abdominal pain, denies back pain.  Denies urinary symptoms

## 2018-01-04 NOTE — Discharge Instructions (Signed)
I have sent in Diflucan for you to to treat yeast infection, please take 1 tablet today, may repeat in a couple of days if still having symptoms.  We are testing you for Gonorrhea, Chlamydia, Trichomonas, Yeast and Bacterial Vaginosis. We will call you if anything is positive and let you know if you require any further treatment. Please inform partners of any positive results.   Please return if symptoms not improving with treatment, development of fever, nausea, vomiting, abdominal pain.

## 2018-01-06 LAB — CERVICOVAGINAL ANCILLARY ONLY
BACTERIAL VAGINITIS: NEGATIVE
CANDIDA VAGINITIS: NEGATIVE
CHLAMYDIA, DNA PROBE: NEGATIVE
Neisseria Gonorrhea: NEGATIVE
Trichomonas: NEGATIVE

## 2018-04-21 ENCOUNTER — Other Ambulatory Visit: Payer: Self-pay

## 2018-04-21 ENCOUNTER — Encounter (HOSPITAL_COMMUNITY): Payer: Self-pay

## 2018-04-21 ENCOUNTER — Ambulatory Visit (HOSPITAL_COMMUNITY)
Admission: EM | Admit: 2018-04-21 | Discharge: 2018-04-21 | Disposition: A | Discharge status: Home / Self Care | Payer: Self-pay | Attending: Family Medicine | Admitting: Family Medicine

## 2018-04-21 ENCOUNTER — Telehealth (HOSPITAL_COMMUNITY): Payer: Self-pay

## 2018-04-21 DIAGNOSIS — M542 Cervicalgia: Secondary | ICD-10-CM | POA: Insufficient documentation

## 2018-04-21 MED ORDER — CYCLOBENZAPRINE HCL 10 MG PO TABS: 5.0000 mg | ORAL_TABLET | Freq: Every day | ORAL | 0 refills | Status: DC

## 2018-04-21 MED ORDER — KETOROLAC TROMETHAMINE 30 MG/ML IJ SOLN
30.0000 mg | Freq: Once | INTRAMUSCULAR | Status: AC
  Administered 2018-04-21: 30 mg via INTRAMUSCULAR

## 2018-04-21 MED ORDER — KETOROLAC TROMETHAMINE 30 MG/ML IJ SOLN
INTRAMUSCULAR | Status: AC
  Filled 2018-04-21: qty 1

## 2018-04-21 NOTE — ED Provider Notes (Addendum)
[place]    CSN: [id number] Arrival date & time: 04/21/18  3532     History   Chief Complaint Chief Complaint  Patient presents with  . Neck Pain    HPI Jennifer Strong is a 27 y.o. female.   Patient is an otherwise healthy 27 year old female presents with right lateral neck pain that started last night.  This started while she was sleeping and reports turned her neck feeling a strain.  When she woke up this morning the pain was worse and she was unable to rotate her neck to the right.  She came straight here.  She has not taken anything for the pain.  She denies any associated fever, injuries.  Denies any numbness, tingling, radiation of pain.  ROS per HPI    Neck Pain    Past Medical History:  Diagnosis Date  . Bacterial vaginosis     Patient Active Problem List   Diagnosis Date Noted  . Palpitations 05/04/2014  . Hypokalemia 05/04/2014    History reviewed. No pertinent surgical history.  OB History   No obstetric history on file.      Home Medications    Prior to Admission medications   Medication Sig Start Date End Date Taking? Authorizing Provider  cyclobenzaprine (FLEXERIL) 10 MG tablet Take 0.5 tablets (5 mg total) by mouth at bedtime. 04/21/18   Orvan July, NP    Family History Family History  Problem Relation Age of Onset  . Heart attack Maternal Aunt   . Hypertension Maternal Aunt   . Hypertension Maternal Grandfather     Social History Social History   Tobacco Use  . Smoking status: Never Smoker  . Smokeless tobacco: Never Used  Substance Use Topics  . Alcohol use: Yes    Comment: occasional  . Drug use: No     Allergies   Patient has no known allergies.   Review of Systems Review of Systems  Musculoskeletal: Positive for neck pain.     Physical Exam Triage Vital Signs ED Triage Vitals [04/21/18 0929]  Enc Vitals Group     BP 111/68     Pulse Rate 78     Resp 16     Temp 98.4 F (36.9 C)   Temp Source Oral     SpO2 100 %     Weight      Height      Head Circumference      Peak Flow      Pain Score      Pain Loc      Pain Edu?      Excl. in [place]?    No data found.  Updated Vital Signs BP 111/68 (BP Location: Right Arm)   Pulse 78   Temp 98.4 F (36.9 C) (Oral)   Resp 16   LMP 04/12/2018   SpO2 100%   Visual Acuity Right Eye Distance:   Left Eye Distance:   Bilateral Distance:    Right Eye Near:   Left Eye Near:    Bilateral Near:     Physical Exam Vitals signs and nursing note reviewed.  Constitutional:      Appearance: Normal appearance. She is not ill-appearing or toxic-appearing.  HENT:     Head: Normocephalic and atraumatic.     Nose: Nose normal.  Eyes:     Conjunctiva/sclera: Conjunctivae normal.  Neck:     Musculoskeletal: Neck supple. Muscular tenderness present. No neck rigidity.     Comments: Tender  to palpation of the right lateral neck and trapezius muscle.  No cervical bony tenderness.  No swelling, deformities Good flexion but limited extension Limited rotation to the right Pulmonary:     Effort: Pulmonary effort is normal.  Musculoskeletal: Normal range of motion.  Lymphadenopathy:     Cervical: No cervical adenopathy.  Skin:    General: Skin is warm and dry.  Neurological:     Mental Status: She is alert.  Psychiatric:        Mood and Affect: Mood normal.      UC Treatments / Results  Labs (all labs ordered are listed, but only abnormal results are displayed) Labs Reviewed - No data to display  EKG None  Radiology No results found.  Procedures Procedures (including critical care time)  Medications Ordered in UC Medications  ketorolac (TORADOL) 30 MG/ML injection 30 mg (has no administration in time range)    Initial Impression / Assessment and Plan / UC Course  I have reviewed the triage vital signs and the nursing notes.  Pertinent labs & imaging results that were available during my care of the patient  were reviewed by me and considered in my medical decision making (see chart for details).     Most likely muscle strain to the neck Toradol given in clinic for pain and inflammation Low-dose muscle relaxant given for muscle spasm Work note given Instructed to follow-up for continued worsening symptoms Final Clinical Impressions(s) / UC Diagnoses   Final diagnoses:  Neck pain     Discharge Instructions     We gave you a Toradol injection here for pain and inflammation of your neck I am sending a low-dose muscle relaxant to the pharmacy to help with muscle spasm Be read this medication will make you drowsy Heat/gentle stretching/massage to the area could help Work note given for today Follow up as needed for continued or worsening symptoms     ED Prescriptions    Medication Sig Dispense Auth. Provider   cyclobenzaprine (FLEXERIL) 10 MG tablet Take 0.5 tablets (5 mg total) by mouth at bedtime. 20 tablet Loura Halt A, NP     Controlled Substance Prescriptions [place] Controlled Substance Registry consulted? Not Applicable       Orvan July, NP 04/21/18 [phone]

## 2018-04-21 NOTE — Discharge Instructions (Addendum)
We gave you a Toradol injection here for pain and inflammation of your neck I am sending a low-dose muscle relaxant to the pharmacy to help with muscle spasm Be read this medication will make you drowsy Heat/gentle stretching/massage to the area could help Work note given for today Follow up as needed for continued or worsening symptoms

## 2018-04-21 NOTE — ED Triage Notes (Signed)
Pt cc she states she rolled over in bed and  now she thinks she pulled a muscle in her neck.

## 2018-04-21 NOTE — ED Notes (Signed)
Rerouted medications to a different pharmacy.

## 2018-08-03 ENCOUNTER — Encounter (HOSPITAL_COMMUNITY): Payer: Self-pay | Admitting: Emergency Medicine

## 2018-08-03 ENCOUNTER — Ambulatory Visit (HOSPITAL_COMMUNITY)
Admission: EM | Admit: 2018-08-03 | Discharge: 2018-08-03 | Disposition: A | Discharge status: Home / Self Care | Payer: Managed Care, Other (non HMO) | Attending: Family Medicine | Admitting: Family Medicine

## 2018-08-03 ENCOUNTER — Other Ambulatory Visit: Payer: Self-pay

## 2018-08-03 DIAGNOSIS — N898 Other specified noninflammatory disorders of vagina: Secondary | ICD-10-CM

## 2018-08-03 LAB — POCT URINALYSIS DIP (DEVICE)
Bilirubin Urine: NEGATIVE
Glucose, UA: NEGATIVE mg/dL
Hgb urine dipstick: NEGATIVE
Ketones, ur: NEGATIVE mg/dL
Nitrite: NEGATIVE
Protein, ur: NEGATIVE mg/dL
Specific Gravity, Urine: 1.025 (ref 1.005–1.030)
Urobilinogen, UA: 0.2 mg/dL (ref 0.0–1.0)
pH: 5.5 (ref 5.0–8.0)

## 2018-08-03 LAB — POCT PREGNANCY, URINE: Preg Test, Ur: NEGATIVE

## 2018-08-03 MED ORDER — METRONIDAZOLE 500 MG PO TABS: 500.0000 mg | ORAL_TABLET | Freq: Two times a day (BID) | ORAL | 0 refills | Status: AC

## 2018-08-03 MED ORDER — FLUCONAZOLE 150 MG PO TABS: 150.0000 mg | ORAL_TABLET | Freq: Once | ORAL | 0 refills | Status: AC

## 2018-08-03 NOTE — Discharge Instructions (Signed)
Please take 1 tablet of Diflucan today to treat for yeast.  May repeat second tablet in 3 to 4 days if swab returns positive for yeast and still having symptoms.  Please also begin metronidazole twice daily for the next week to treat for bacterial vaginosis.  Please do not drink alcohol until 24 hours after taking the last tablet.  We are testing you for Gonorrhea, Chlamydia, Trichomonas, Yeast and Bacterial Vaginosis. We will call you if anything is positive and let you know if you require any further treatment. Please inform partners of any positive results.   Please return if symptoms not improving with treatment, development of fever, nausea, vomiting, abdominal pain.

## 2018-08-03 NOTE — ED Provider Notes (Signed)
[place]    CSN: [id number] Arrival date & time: 08/03/18  1025     History   Chief Complaint Chief Complaint  Patient presents with  . Vaginal Discharge    HPI Jennifer Strong is a 28 y.o. female no significant past medical history presenting today for evaluation of vaginal discharge and irritation.  Patient states that over the past week she has had discharge.  She is also had associated odor and itching.  She feels like the external area of her genital area has been slightly red and notes that her clitoris is been slightly swollen.  Denies any rashes or lesions.  Denies dysuria or increased frequency.  Denies fever, nausea, vomiting or abdominal pain.  Does state that she has a history of BV and yeast, symptoms feel more like BV.  Last menstrual period was 3/24.  Is not on any form of birth control.  Denies any new partners.   HPI  Past Medical History:  Diagnosis Date  . Bacterial vaginosis     Patient Active Problem List   Diagnosis Date Noted  . Palpitations 05/04/2014  . Hypokalemia 05/04/2014    History reviewed. No pertinent surgical history.  OB History   No obstetric history on file.      Home Medications    Prior to Admission medications   Medication Sig Start Date End Date Taking? Authorizing Provider  fluconazole (DIFLUCAN) 150 MG tablet Take 1 tablet (150 mg total) by mouth once for 1 dose. 08/03/18 08/03/18  Wieters, Hallie C, PA-C  metroNIDAZOLE (FLAGYL) 500 MG tablet Take 1 tablet (500 mg total) by mouth 2 (two) times daily for 7 days. 08/03/18 08/10/18  Wieters, Elesa Hacker, PA-C    Family History Family History  Problem Relation Age of Onset  . Heart attack Maternal Aunt   . Hypertension Maternal Aunt   . Hypertension Maternal Grandfather     Social History Social History   Tobacco Use  . Smoking status: Never Smoker  . Smokeless tobacco: Never Used  Substance Use Topics  . Alcohol use: Yes    Comment: occasional  . Drug use:  No     Allergies   Patient has no known allergies.   Review of Systems Review of Systems  Constitutional: Negative for fever.  Respiratory: Negative for shortness of breath.   Cardiovascular: Negative for chest pain.  Gastrointestinal: Negative for abdominal pain, diarrhea, nausea and vomiting.  Genitourinary: Positive for vaginal discharge. Negative for dysuria, flank pain, genital sores, hematuria, menstrual problem, vaginal bleeding and vaginal pain.  Musculoskeletal: Negative for back pain.  Skin: Negative for rash.  Neurological: Negative for dizziness, light-headedness and headaches.     Physical Exam Triage Vital Signs ED Triage Vitals [08/03/18 1055]  Enc Vitals Group     BP 110/72     Pulse Rate 71     Resp 18     Temp 98.7 F (37.1 C)     Temp Source Oral     SpO2 95 %     Weight      Height      Head Circumference      Peak Flow      Pain Score 0     Pain Loc      Pain Edu?      Excl. in [place]?    No data found.  Updated Vital Signs BP 110/72 (BP Location: Right Arm)   Pulse 71   Temp 98.7 F (37.1 C) (Oral)  Resp 18   LMP 07/22/2018   SpO2 95%   Visual Acuity Right Eye Distance:   Left Eye Distance:   Bilateral Distance:    Right Eye Near:   Left Eye Near:    Bilateral Near:     Physical Exam Vitals signs and nursing note reviewed.  Constitutional:      Appearance: She is well-developed.     Comments: No acute distress  HENT:     Head: Normocephalic and atraumatic.     Nose: Nose normal.  Eyes:     Conjunctiva/sclera: Conjunctivae normal.  Neck:     Musculoskeletal: Neck supple.  Cardiovascular:     Rate and Rhythm: Normal rate.  Pulmonary:     Effort: Pulmonary effort is normal. No respiratory distress.  Abdominal:     General: There is no distension.     Comments: Abdomen soft, nondistended, mild tenderness to left upper quadrant and epigastrium, negative rebound, nontender to right upper quadrant, right lower quadrant and  left lower quadrant.  Negative McBurney's, negative Rovsing  Musculoskeletal: Normal range of motion.  Skin:    General: Skin is warm and dry.  Neurological:     Mental Status: She is alert and oriented to person, place, and time.      UC Treatments / Results  Labs (all labs ordered are listed, but only abnormal results are displayed) Labs Reviewed  POCT URINALYSIS DIP (DEVICE) - Abnormal; Notable for the following components:      Result Value   Leukocytes,Ua SMALL (*)    All other components within normal limits  POC URINE PREG, ED  POCT PREGNANCY, URINE  CERVICOVAGINAL ANCILLARY ONLY    EKG None  Radiology No results found.  Procedures Procedures (including critical care time)  Medications Ordered in UC Medications - No data to display  Initial Impression / Assessment and Plan / UC Course  I have reviewed the triage vital signs and the nursing notes.  Pertinent labs & imaging results that were available during my care of the patient were reviewed by me and considered in my medical decision making (see chart for details).     Patient likely with vaginitis/vaginal discharge likely from yeast or BV.  Given external irritation and clitoral swelling with itching will empirically treat for yeast as well as BV.  Vaginal swab obtained and will send off to confirm as well as check for STDs.  Will call patient with results and alter treatment as needed.Discussed strict return precautions. Patient verbalized understanding and is agreeable with plan.  Final Clinical Impressions(s) / UC Diagnoses   Final diagnoses:  Vaginal discharge     Discharge Instructions     Please take 1 tablet of Diflucan today to treat for yeast.  May repeat second tablet in 3 to 4 days if swab returns positive for yeast and still having symptoms.  Please also begin metronidazole twice daily for the next week to treat for bacterial vaginosis.  Please do not drink alcohol until 24 hours after taking  the last tablet.  We are testing you for Gonorrhea, Chlamydia, Trichomonas, Yeast and Bacterial Vaginosis. We will call you if anything is positive and let you know if you require any further treatment. Please inform partners of any positive results.   Please return if symptoms not improving with treatment, development of fever, nausea, vomiting, abdominal pain.    ED Prescriptions    Medication Sig Dispense Auth. Provider   fluconazole (DIFLUCAN) 150 MG tablet Take 1 tablet (150  mg total) by mouth once for 1 dose. 2 tablet Wieters, Hallie C, PA-C   metroNIDAZOLE (FLAGYL) 500 MG tablet Take 1 tablet (500 mg total) by mouth 2 (two) times daily for 7 days. 14 tablet Wieters, [place] C, PA-C     Controlled Substance Prescriptions [place] Controlled Substance Registry consulted? Not Applicable   Janith Lima, [place] 08/03/18 1136

## 2018-08-03 NOTE — ED Triage Notes (Signed)
The patient presented to the [facility] with a complaint of a vaginal d/c x 1 week. The patient reported an odor and itching that started yesterday. The patient denied any abdominal pain or dysuria.

## 2018-08-04 LAB — CERVICOVAGINAL ANCILLARY ONLY
Bacterial vaginitis: NEGATIVE
Candida vaginitis: POSITIVE — AB
Chlamydia: NEGATIVE
Neisseria Gonorrhea: NEGATIVE
Trichomonas: NEGATIVE

## 2018-08-07 ENCOUNTER — Ambulatory Visit (HOSPITAL_COMMUNITY)
Admission: EM | Admit: 2018-08-07 | Discharge: 2018-08-07 | Disposition: A | Discharge status: Home / Self Care | Payer: Managed Care, Other (non HMO) | Attending: Family Medicine | Admitting: Family Medicine

## 2018-08-07 ENCOUNTER — Other Ambulatory Visit: Payer: Self-pay

## 2018-08-07 ENCOUNTER — Encounter (HOSPITAL_COMMUNITY): Payer: Self-pay

## 2018-08-07 DIAGNOSIS — B373 Candidiasis of vulva and vagina: Secondary | ICD-10-CM | POA: Diagnosis not present

## 2018-08-07 DIAGNOSIS — B3731 Acute candidiasis of vulva and vagina: Secondary | ICD-10-CM

## 2018-08-07 MED ORDER — FLUCONAZOLE 150 MG PO TABS: 150.0000 mg | ORAL_TABLET | Freq: Every day | ORAL | 0 refills | Status: DC

## 2018-08-07 MED ORDER — TERCONAZOLE 0.8 % VA CREA: 1.0000 | TOPICAL_CREAM | Freq: Every day | VAGINAL | 0 refills | Status: DC

## 2018-08-07 NOTE — ED Provider Notes (Signed)
[place]    CSN: [id number] Arrival date & time: 08/07/18  1443     History   Chief Complaint Chief Complaint  Patient presents with  . Vaginal Itching    HPI Jennifer Strong is a 28 y.o. female.   HPI  Patient states that she was seen on 08/03/2018 for vaginitis.  She states that a swab was done and she was treated with metronidazole twice a day for 5 days and Diflucan, 1 now and 1 in several days if needed.  She is taking both the Diflucan.  She still has vaginal irritation.  She is here for follow-up.  She is requesting a pelvic examination to determine the cause of her infection because she is failed to improve with Diflucan.  She states usually Diflucan works for her.  She never did start the metronidazole.  I looked at her test result and she was positive only for yeast, she does not need the metronidazole and is advised not to take.  She was negative for GC, chlamydia, pregnancy, and trichomonas. He does not have diabetes.  She does not feel like she has recurring infections.  Past Medical History:  Diagnosis Date  . Bacterial vaginosis     Patient Active Problem List   Diagnosis Date Noted  . Palpitations 05/04/2014  . Hypokalemia 05/04/2014    History reviewed. No pertinent surgical history.  OB History   No obstetric history on file.      Home Medications    Prior to Admission medications   Medication Sig Start Date End Date Taking? Authorizing Provider  fluconazole (DIFLUCAN) 150 MG tablet Take 1 tablet (150 mg total) by mouth daily. Repeat in 1 week if needed 08/07/18   Raylene Everts, MD  metroNIDAZOLE (FLAGYL) 500 MG tablet Take 1 tablet (500 mg total) by mouth 2 (two) times daily for 7 days. 08/03/18 08/10/18  Wieters, Hallie C, PA-C  terconazole (TERAZOL 3) 0.8 % vaginal cream Place 1 applicator vaginally at bedtime. 08/07/18   Raylene Everts, MD    Family History Family History  Problem Relation Age of Onset  . Heart attack  Maternal Aunt   . Hypertension Maternal Aunt   . Hypertension Maternal Grandfather     Social History Social History   Tobacco Use  . Smoking status: Never Smoker  . Smokeless tobacco: Never Used  Substance Use Topics  . Alcohol use: Yes    Comment: occasional  . Drug use: No     Allergies   Patient has no known allergies.   Review of Systems Review of Systems  Constitutional: Negative for chills and fever.  HENT: Negative for ear pain and sore throat.   Eyes: Negative for pain and visual disturbance.  Respiratory: Negative for cough and shortness of breath.   Cardiovascular: Negative for chest pain and palpitations.  Gastrointestinal: Negative for abdominal pain and vomiting.  Genitourinary: Positive for vaginal discharge. Negative for dysuria and hematuria.  Musculoskeletal: Negative for arthralgias and back pain.  Skin: Negative for color change and rash.  Neurological: Negative for seizures and syncope.  All other systems reviewed and are negative.    Physical Exam Triage Vital Signs ED Triage Vitals  Enc Vitals Group     BP 08/07/18 0816 106/64     Pulse Rate 08/07/18 0816 63     Resp 08/07/18 0816 16     Temp 08/07/18 0816 98.2 F (36.8 C)     Temp Source 08/07/18 0816 Oral  SpO2 08/07/18 0816 100 %     Weight --      Height --      Head Circumference --      Peak Flow --      Pain Score 08/07/18 0817 8     Pain Loc --      Pain Edu? --      Excl. in [place]? --    No data found.  Updated Vital Signs BP 106/64 (BP Location: Right Arm)   Pulse 63   Temp 98.2 F (36.8 C) (Oral)   Resp 16   LMP 07/22/2018   SpO2 100%     Physical Exam Constitutional:      General: She is not in acute distress.    Appearance: She is well-developed.  HENT:     Head: Normocephalic and atraumatic.  Eyes:     Conjunctiva/sclera: Conjunctivae normal.     Pupils: Pupils are equal, round, and reactive to light.  Neck:     Musculoskeletal: Normal range of motion.   Cardiovascular:     Rate and Rhythm: Normal rate.  Pulmonary:     Effort: Pulmonary effort is normal. No respiratory distress.  Abdominal:     General: There is no distension.     Palpations: Abdomen is soft.  Genitourinary:    Exam position: Lithotomy position.    Musculoskeletal: Normal range of motion.  Skin:    General: Skin is warm and dry.  Neurological:     Mental Status: She is alert.      UC Treatments / Results  Labs (all labs ordered are listed, but only abnormal results are displayed) Labs Reviewed - No data to display  EKG None  Radiology No results found.  Procedures Procedures (including critical care time)  Medications Ordered in UC Medications - No data to display  Initial Impression / Assessment and Plan / UC Course  I have reviewed the triage vital signs and the nursing notes.  Pertinent labs & imaging results that were available during my care of the patient were reviewed by me and considered in my medical decision making (see chart for details).     Explained to the patient that she had a yeast infection that was not improving with treatment.  I was going to give her terconazole in addition to the Diflucan to see if this would help.  I explained that there was possibility that there was a viral infection as well, but it is impossible for me to do testing for this given the late evaluation, I do not.  Predict culture would be positive.  She should return if she has a recurrence of a painful rash Final Clinical Impressions(s) / UC Diagnoses   Final diagnoses:  Vulvovaginitis due to yeast     Discharge Instructions     Continue taking the Diflucan twice a week for 1 more week Use terconazole as directed May use an over-the-counter product such as vagisil for the itching See a GYN in follow up   ED Prescriptions    Medication Sig Dispense Auth. Provider   terconazole (TERAZOL 3) 0.8 % vaginal cream Place 1 applicator vaginally at  bedtime. 20 g Raylene Everts, MD   fluconazole (DIFLUCAN) 150 MG tablet Take 1 tablet (150 mg total) by mouth daily. Repeat in 1 week if needed 2 tablet Raylene Everts, MD     Controlled Substance Prescriptions [place] Controlled Substance Registry consulted? Not Applicable   Raylene Everts, MD 08/07/18 [phone]

## 2018-08-07 NOTE — Discharge Instructions (Signed)
Continue taking the Diflucan twice a week for 1 more week Use terconazole as directed May use an over-the-counter product such as vagisil for the itching See a GYN in follow up

## 2018-08-07 NOTE — ED Triage Notes (Signed)
Pt present a vaginal itching with some discharge.pt states she took the diflucan that she was prescribed but symptoms has gotten worst.

## 2018-09-09 ENCOUNTER — Other Ambulatory Visit: Payer: Self-pay

## 2018-09-09 ENCOUNTER — Ambulatory Visit (HOSPITAL_COMMUNITY)
Admission: EM | Admit: 2018-09-09 | Discharge: 2018-09-09 | Disposition: A | Discharge status: Home / Self Care | Payer: Managed Care, Other (non HMO) | Attending: Family Medicine | Admitting: Family Medicine

## 2018-09-09 ENCOUNTER — Encounter (HOSPITAL_COMMUNITY): Payer: Self-pay

## 2018-09-09 DIAGNOSIS — N760 Acute vaginitis: Secondary | ICD-10-CM | POA: Insufficient documentation

## 2018-09-09 LAB — POCT URINALYSIS DIP (DEVICE)
Bilirubin Urine: NEGATIVE
Glucose, UA: NEGATIVE mg/dL
Hgb urine dipstick: NEGATIVE
Ketones, ur: NEGATIVE mg/dL
Nitrite: NEGATIVE
Protein, ur: NEGATIVE mg/dL
Specific Gravity, Urine: 1.025 (ref 1.005–1.030)
Urobilinogen, UA: 0.2 mg/dL (ref 0.0–1.0)
pH: 6.5 (ref 5.0–8.0)

## 2018-09-09 MED ORDER — FLUCONAZOLE 150 MG PO TABS: 150.0000 mg | ORAL_TABLET | Freq: Every day | ORAL | 0 refills | Status: DC

## 2018-09-09 MED ORDER — TERCONAZOLE 0.8 % VA CREA: 1.0000 | TOPICAL_CREAM | Freq: Every day | VAGINAL | 0 refills | Status: DC

## 2018-09-09 NOTE — ED Provider Notes (Signed)
[place]    CSN: [id number] Arrival date & time: 09/09/18  1343     History   Chief Complaint Chief Complaint  Patient presents with  . Vaginal Discharge    HPI Jennifer Strong is a 28 y.o. female.   HPI  Patient complains of recurring yeast infection.  She would like to be checked for STDs.  She does not think she has an STD or exposure but wants to be careful.  She has an itchy discharge for 1 day.  It feels like a typical yeast infection to her.  Just treated for this a month ago.  Past Medical History:  Diagnosis Date  . Bacterial vaginosis     Patient Active Problem List   Diagnosis Date Noted  . Palpitations 05/04/2014  . Hypokalemia 05/04/2014    History reviewed. No pertinent surgical history.  OB History   No obstetric history on file.      Home Medications    Prior to Admission medications   Medication Sig Start Date End Date Taking? Authorizing Provider  fluconazole (DIFLUCAN) 150 MG tablet Take 1 tablet (150 mg total) by mouth daily. Repeat in 1 week if needed 09/09/18   Raylene Everts, MD  terconazole (TERAZOL 3) 0.8 % vaginal cream Place 1 applicator vaginally at bedtime. 09/09/18   Raylene Everts, MD    Family History Family History  Problem Relation Age of Onset  . Heart attack Maternal Aunt   . Hypertension Maternal Aunt   . Hypertension Maternal Grandfather     Social History Social History   Tobacco Use  . Smoking status: Never Smoker  . Smokeless tobacco: Never Used  Substance Use Topics  . Alcohol use: Yes    Comment: occasional  . Drug use: No     Allergies   Patient has no known allergies.   Review of Systems Review of Systems  Constitutional: Negative for chills and fever.  HENT: Negative for ear pain and sore throat.   Eyes: Negative for pain and visual disturbance.  Respiratory: Negative for cough and shortness of breath.   Cardiovascular: Negative for chest pain and palpitations.   Gastrointestinal: Negative for abdominal pain and vomiting.  Genitourinary: Positive for vaginal discharge. Negative for dysuria, frequency, hematuria, menstrual problem and pelvic pain.  Musculoskeletal: Negative for arthralgias and back pain.  Skin: Negative for color change and rash.  Neurological: Negative for seizures and syncope.  All other systems reviewed and are negative.    Physical Exam Triage Vital Signs ED Triage Vitals  Enc Vitals Group     BP 09/09/18 1356 105/69     Pulse Rate 09/09/18 1356 65     Resp 09/09/18 1356 16     Temp 09/09/18 1356 98.2 F (36.8 C)     Temp Source 09/09/18 1356 Oral     SpO2 09/09/18 1356 100 %     Weight 09/09/18 1353 134 lb (60.8 kg)   No data found.  Updated Vital Signs BP 105/69 (BP Location: Right Arm)   Pulse 65   Temp 98.2 F (36.8 C) (Oral)   Resp 16   Wt 60.8 kg   LMP 08/20/2018   SpO2 100%   BMI 23.00 kg/m      Physical Exam Constitutional:      General: She is not in acute distress.    Appearance: She is well-developed and normal weight.  HENT:     Head: Normocephalic and atraumatic.  Eyes:  Conjunctiva/sclera: Conjunctivae normal.     Pupils: Pupils are equal, round, and reactive to light.  Neck:     Musculoskeletal: Normal range of motion.  Cardiovascular:     Rate and Rhythm: Normal rate.  Pulmonary:     Effort: Pulmonary effort is normal. No respiratory distress.  Abdominal:     General: There is no distension.     Palpations: Abdomen is soft.  Genitourinary:    Comments: Examination deferred Musculoskeletal: Normal range of motion.  Skin:    General: Skin is warm and dry.  Neurological:     Mental Status: She is alert.  Psychiatric:        Mood and Affect: Mood normal.        Behavior: Behavior normal.      UC Treatments / Results  Labs (all labs ordered are listed, but only abnormal results are displayed) Labs Reviewed  POCT URINALYSIS DIP (DEVICE) - Abnormal; Notable for the  following components:      Result Value   Leukocytes,Ua SMALL (*)    All other components within normal limits  CERVICOVAGINAL ANCILLARY ONLY    EKG None  Radiology No results found.  Procedures Procedures (including critical care time)  Medications Ordered in UC Medications - No data to display  Initial Impression / Assessment and Plan / UC Course  I have reviewed the triage vital signs and the nursing notes.  Pertinent labs & imaging results that were available during my care of the patient were reviewed by me and considered in my medical decision making (see chart for details).    Recommend follow-up with GYN for recurring yeast infections Final Clinical Impressions(s) / UC Diagnoses   Final diagnoses:  Acute vaginitis     Discharge Instructions     Use the medication as directed See women's health next month   ED Prescriptions    Medication Sig Dispense Auth. Provider   fluconazole (DIFLUCAN) 150 MG tablet Take 1 tablet (150 mg total) by mouth daily. Repeat in 1 week if needed 2 tablet Raylene Everts, MD   terconazole (TERAZOL 3) 0.8 % vaginal cream Place 1 applicator vaginally at bedtime. 20 g Raylene Everts, MD     Controlled Substance Prescriptions [place] Controlled Substance Registry consulted? Not Applicable   Raylene Everts, MD 09/09/18 [phone]

## 2018-09-09 NOTE — Discharge Instructions (Signed)
Use the medication as directed See women's health next month

## 2018-09-09 NOTE — ED Triage Notes (Signed)
Pt cc she thinks she has a ( yeast infection ) vaginal discharge for a week. Pt states her vagina is swollen and itching x 2 days.

## 2018-09-10 LAB — CERVICOVAGINAL ANCILLARY ONLY
Chlamydia: NEGATIVE
Neisseria Gonorrhea: NEGATIVE
Trichomonas: NEGATIVE

## 2018-09-20 ENCOUNTER — Telehealth (HOSPITAL_COMMUNITY): Payer: Self-pay | Admitting: Emergency Medicine

## 2018-09-20 NOTE — Telephone Encounter (Signed)
Patient contacted and made aware of all results, all questions answered.   

## 2018-10-01 ENCOUNTER — Encounter (HOSPITAL_COMMUNITY): Payer: Self-pay

## 2018-10-01 ENCOUNTER — Other Ambulatory Visit: Payer: Self-pay

## 2018-10-01 ENCOUNTER — Ambulatory Visit (HOSPITAL_COMMUNITY)
Admission: EM | Admit: 2018-10-01 | Discharge: 2018-10-01 | Disposition: A | Discharge status: Home / Self Care | Payer: Self-pay | Attending: Family Medicine | Admitting: Family Medicine

## 2018-10-01 DIAGNOSIS — N898 Other specified noninflammatory disorders of vagina: Secondary | ICD-10-CM | POA: Insufficient documentation

## 2018-10-01 MED ORDER — FLUCONAZOLE 150 MG PO TABS: ORAL_TABLET | ORAL | 0 refills | Status: DC

## 2018-10-01 MED ORDER — METRONIDAZOLE 500 MG PO TABS: 500.0000 mg | ORAL_TABLET | Freq: Two times a day (BID) | ORAL | 0 refills | Status: DC

## 2018-10-01 NOTE — ED Triage Notes (Signed)
Patient presents to Urgent Care with complaints of vaginal discharge that itches since 5/25. Patient reports she thought it was a yeast infection and now is worried it is BV.

## 2018-10-01 NOTE — Discharge Instructions (Addendum)
We have sent testing for sexually transmitted infections and for other things that my cause vaginal discharge. We will notify you of any positive results once they are received. If required, we will prescribe any medications you might need.  Please refrain from all sexual activity for at least the next seven days.

## 2018-10-01 NOTE — ED Provider Notes (Signed)
[place]   [id number] 10/01/18 Arrival Time: 8341  ASSESSMENT & PLAN:  1. Vaginal discharge    No s/s of PID.  Will empirically treat for yeast and BV with: Meds ordered this encounter  Medications  . fluconazole (DIFLUCAN) 150 MG tablet    Sig: Take one tablet by mouth as a single dose.    Dispense:  1 tablet    Refill:  0  . metroNIDAZOLE (FLAGYL) 500 MG tablet    Sig: Take 1 tablet (500 mg total) by mouth 2 (two) times daily.    Dispense:  14 tablet    Refill:  0     Discharge Instructions     We have sent testing for sexually transmitted infections and for other things that my cause vaginal discharge. We will notify you of any positive results once they are received. If required, we will prescribe any medications you might need.  Please refrain from all sexual activity for at least the next seven days.    Vaginal cytology pending.  Will notify of any positive results. Instructed to refrain from sexual activity for at least seven days.  Recommend: Follow-up Information    Schedule an appointment as soon as possible for a visit  with [place].   Contact information: [place] [phone]         Reviewed expectations re: course of current medical issues. Questions answered. Outlined signs and symptoms indicating need for more acute intervention. Patient verbalized understanding. After Visit Summary given.   SUBJECTIVE:  Jennifer Strong is a 28 y.o. female who presents with complaint of vaginal discharge. Onset gradual. First noticed several days ago. Describes discharge as white initially; now more clear. Urinary symptoms: none. Afebrile. No abdominal or pelvic pain. No n/v. No rashes or lesions. Sexually active with single female partner without regular condom use. OTC treatment: none. Treated for a yeast infection here approx 2 weeks ago.  ROS: As per HPI.  OBJECTIVE:  Vitals:    10/01/18 1051  BP: 107/73  Pulse: 68  Resp: 17  Temp: 98.5 F (36.9 C)  TempSrc: Oral  SpO2: 100%    General appearance: alert, cooperative, appears stated age and no distress Throat: lips, mucosa, and tongue normal; teeth and gums normal CV: RRR Lungs: CTAB Back: no CVA tenderness; FROM at waist Abdomen: soft, non-tender GU: deferred Skin: warm and dry Psychological: alert and cooperative; normal mood and affect.  Results for orders placed or performed during the hospital encounter of 09/09/18  POCT urinalysis dip (device)  Result Value Ref Range   Glucose, UA NEGATIVE NEGATIVE mg/dL   Bilirubin Urine NEGATIVE NEGATIVE   Ketones, ur NEGATIVE NEGATIVE mg/dL   Specific Gravity, Urine 1.025 1.005 - 1.030   Hgb urine dipstick NEGATIVE NEGATIVE   pH 6.5 5.0 - 8.0   Protein, ur NEGATIVE NEGATIVE mg/dL   Urobilinogen, UA 0.2 0.0 - 1.0 mg/dL   Nitrite NEGATIVE NEGATIVE   Leukocytes,Ua SMALL (A) NEGATIVE  Cervicovaginal ancillary only  Result Value Ref Range   Chlamydia Negative    Neisseria gonorrhea Negative    Trichomonas Negative     Labs Reviewed - No data to display  No Known Allergies  Past Medical History:  Diagnosis Date  . Bacterial vaginosis    Family History  Problem Relation Age of Onset  . Healthy Mother   . Healthy Father   . Heart attack Maternal Aunt   . Hypertension Maternal Aunt   .  Hypertension Maternal Grandfather    Social History   Socioeconomic History  . Marital status: Single    Spouse name: Not on file  . Number of children: Not on file  . Years of education: Not on file  . Highest education level: Not on file  Occupational History  . Not on file  Social Needs  . Financial resource strain: Not on file  . Food insecurity:    Worry: Not on file    Inability: Not on file  . Transportation needs:    Medical: Not on file    Non-medical: Not on file  Tobacco Use  . Smoking status: Never Smoker  . Smokeless tobacco: Never Used   Substance and Sexual Activity  . Alcohol use: Yes    Comment: occasional  . Drug use: No  . Sexual activity: Yes    Birth control/protection: Condom, None  Lifestyle  . Physical activity:    Days per week: Not on file    Minutes per session: Not on file  . Stress: Not on file  Relationships  . Social connections:    Talks on phone: Not on file    Gets together: Not on file    Attends religious service: Not on file    Active member of club or organization: Not on file    Attends meetings of clubs or organizations: Not on file    Relationship status: Not on file  . Intimate partner violence:    Fear of current or ex partner: Not on file    Emotionally abused: Not on file    Physically abused: Not on file    Forced sexual activity: Not on file  Other Topics Concern  . Not on file  Social History Narrative  . Not on file          Vanessa Kick, MD 10/01/18 [phone]

## 2018-10-03 LAB — CERVICOVAGINAL ANCILLARY ONLY
Bacterial vaginitis: POSITIVE — AB
Candida vaginitis: POSITIVE — AB
Chlamydia: NEGATIVE
Neisseria Gonorrhea: NEGATIVE
Trichomonas: NEGATIVE

## 2019-02-15 ENCOUNTER — Other Ambulatory Visit: Payer: Self-pay

## 2019-02-15 ENCOUNTER — Encounter (HOSPITAL_COMMUNITY): Payer: Self-pay

## 2019-02-15 ENCOUNTER — Ambulatory Visit (HOSPITAL_COMMUNITY)
Admission: EM | Admit: 2019-02-15 | Discharge: 2019-02-15 | Disposition: A | Discharge status: Home / Self Care | Payer: Self-pay | Attending: Emergency Medicine | Admitting: Emergency Medicine

## 2019-02-15 DIAGNOSIS — Z3491 Encounter for supervision of normal pregnancy, unspecified, first trimester: Secondary | ICD-10-CM | POA: Insufficient documentation

## 2019-02-15 DIAGNOSIS — N898 Other specified noninflammatory disorders of vagina: Secondary | ICD-10-CM

## 2019-02-15 NOTE — Discharge Instructions (Signed)
Will notify you of any positive findings and if any changes to treatment are needed.  You may monitor your results on your MyChart online as well.  Some increase in vaginal discharge can be normal during pregnancy. Take a daily prenatal vitamin.  Continue to follow up with your OB as scheduled.  Please be seen for any abdominal pain, vaginal bleeding or otherwise worsening.

## 2019-02-15 NOTE — ED Triage Notes (Signed)
Pt states she has vaginal discharge.x 2 days  Pt states she would like to get tested for STDs.

## 2019-02-15 NOTE — ED Provider Notes (Signed)
[place]    CSN: [id number] Arrival date & time: 02/15/19  1000      History   Chief Complaint Chief Complaint  Patient presents with  . Vaginal Discharge    HPI Jennifer Strong is a 28 y.o. female.   Jennifer Strong presents with complaints of vaginal discharge, thick and white, which started 2 days ago. No itching. No pain. No vaginal bleeding. No urinary symptoms. She is currently pregnant, LMP 8/23. First OB 10/23. This is her first pregnancy. History of BV and yeast infection in the past, states this does feel similar. Maybe some odor. She is sexually active with 1 partner and doesn't use condoms. States she would like to be tested for STDs, no specific known exposure.    ROS per HPI, negative if not otherwise mentioned.      Past Medical History:  Diagnosis Date  . Bacterial vaginosis     Patient Active Problem List   Diagnosis Date Noted  . Palpitations 05/04/2014  . Hypokalemia 05/04/2014    History reviewed. No pertinent surgical history.  OB History    Gravida  1   Para      Term      Preterm      AB      Living        SAB      TAB      Ectopic      Multiple      Live Births               Home Medications    Prior to Admission medications   Medication Sig Start Date End Date Taking? Authorizing Provider  fluconazole (DIFLUCAN) 150 MG tablet Take one tablet by mouth as a single dose. 10/01/18   Vanessa Kick, MD  metroNIDAZOLE (FLAGYL) 500 MG tablet Take 1 tablet (500 mg total) by mouth 2 (two) times daily. 10/01/18   Vanessa Kick, MD    Family History Family History  Problem Relation Age of Onset  . Healthy Mother   . Healthy Father   . Heart attack Maternal Aunt   . Hypertension Maternal Aunt   . Hypertension Maternal Grandfather     Social History Social History   Tobacco Use  . Smoking status: Never Smoker  . Smokeless tobacco: Never Used  Substance Use Topics  . Alcohol use: Yes   Comment: occasional  . Drug use: No     Allergies   Patient has no known allergies.   Review of Systems Review of Systems   Physical Exam Triage Vital Signs ED Triage Vitals  Enc Vitals Group     BP 02/15/19 1017 116/75     Pulse Rate 02/15/19 1017 75     Resp 02/15/19 1017 16     Temp 02/15/19 1017 99.1 F (37.3 C)     Temp Source 02/15/19 1017 Oral     SpO2 02/15/19 1017 100 %     Weight 02/15/19 1015 128 lb (58.1 kg)     Height --      Head Circumference --      Peak Flow --      Pain Score 02/15/19 1015 0     Pain Loc --      Pain Edu? --      Excl. in [place]? --    No data found.  Updated Vital Signs BP 116/75 (BP Location: Right Arm)   Pulse 75   Temp 99.1 F (37.3 C) (  Oral)   Resp 16   Wt 128 lb (58.1 kg)   LMP 12/21/2018   SpO2 100%   BMI 21.97 kg/m   Visual Acuity Right Eye Distance:   Left Eye Distance:   Bilateral Distance:    Right Eye Near:   Left Eye Near:    Bilateral Near:     Physical Exam Constitutional:      General: She is not in acute distress.    Appearance: She is well-developed.  Cardiovascular:     Rate and Rhythm: Normal rate.  Pulmonary:     Effort: Pulmonary effort is normal.  Abdominal:     Palpations: Abdomen is soft. Abdomen is not rigid.     Tenderness: There is no abdominal tenderness. There is no guarding or rebound.  Genitourinary:    Comments: Denies sores, lesions, vaginal bleeding; no pelvic pain; gu exam deferred at this time, vaginal self swab collected.   Skin:    General: Skin is warm and dry.  Neurological:     Mental Status: She is alert and oriented to person, place, and time.      UC Treatments / Results  Labs (all labs ordered are listed, but only abnormal results are displayed) Labs Reviewed  CERVICOVAGINAL ANCILLARY ONLY    EKG   Radiology No results found.  Procedures Procedures (including critical care time)  Medications Ordered in UC Medications - No data to display  Initial  Impression / Assessment and Plan / UC Course  I have reviewed the triage vital signs and the nursing notes.  Pertinent labs & imaging results that were available during my care of the patient were reviewed by me and considered in my medical decision making (see chart for details).     Deferred any empiric treatment in this pregnant patient, will await final vaginal cytology to initiate treatment. Follows up with OB 10/23. Return precautions provided. Patient verbalized understanding and agreeable to plan.   Final Clinical Impressions(s) / UC Diagnoses   Final diagnoses:  Vaginal discharge  First trimester pregnancy     Discharge Instructions     Will notify you of any positive findings and if any changes to treatment are needed.  You may monitor your results on your MyChart online as well.  Some increase in vaginal discharge can be normal during pregnancy. Take a daily prenatal vitamin.  Continue to follow up with your OB as scheduled.  Please be seen for any abdominal pain, vaginal bleeding or otherwise worsening.    ED Prescriptions    None     PDMP not reviewed this encounter.   Zigmund Gottron, NP 02/15/19 1038

## 2019-02-17 ENCOUNTER — Telehealth (HOSPITAL_COMMUNITY): Payer: Self-pay | Admitting: Emergency Medicine

## 2019-02-17 LAB — CERVICOVAGINAL ANCILLARY ONLY
Bacterial vaginitis: POSITIVE — AB
Candida vaginitis: NEGATIVE
Chlamydia: NEGATIVE
Neisseria Gonorrhea: NEGATIVE
Trichomonas: NEGATIVE

## 2019-02-17 MED ORDER — CLINDAMYCIN HCL 150 MG PO CAPS: 300.0000 mg | ORAL_CAPSULE | Freq: Two times a day (BID) | ORAL | 0 refills | Status: AC

## 2019-02-17 NOTE — Telephone Encounter (Signed)
Bacterial vaginosis is positive. This was not treated at the urgent care visit.  Flagyl 500 mg BID x 7 days #14 no refills sent to patients pharmacy of choice.    Patient contacted and made aware of    results, all questions answered  Reviewed chart with traci, pt is 2 months pregnant. Per traci, send 300mg  bid x7 days for BV.

## 2019-05-01 NOTE — L&D Delivery Note (Signed)
Operative Delivery Note At 1:34 PM a viable female was delivered via Vaginal, Spontaneous.  Presentation: vertex; Position: Right,, Occiput,, Anterior; Station: +3.  Delivery of the head:  First maneuver: 09/18/2019  1:32 PM, [place] Second maneuver: 09/18/2019  1:33 PM, Suprapubic Pressure Third maneuver: 09/18/2019  1:34 PM,  Delivery of posterior arm (left arm)  Total time of Shoulder dystocia 1 1/2 minutes   APGAR:9 , 9; weight pending  .   Placenta status: spontaneously with 3 vessel cord , .   Cord:  with the following complications:none .  Cord pH: not obtained  Anesthesia:  epidural Episiotomy: None Lacerations: 1st degree;Perineal Suture Repair: 3.0 chromic Est. Blood Loss (mL): 300  Mom to postpartum.  Baby to Couplet care / Skin to Skin.  Baby moving both arms in  L and D normally   Cyril Mourning 09/18/2019, 2:03 PM

## 2019-08-12 ENCOUNTER — Inpatient Hospital Stay (HOSPITAL_COMMUNITY)
Admission: AD | Admit: 2019-08-12 | Payer: BC Managed Care – PPO | Source: Home / Self Care | Admitting: Obstetrics & Gynecology

## 2019-09-17 ENCOUNTER — Encounter (HOSPITAL_COMMUNITY): Payer: Self-pay | Admitting: Obstetrics and Gynecology

## 2019-09-17 ENCOUNTER — Inpatient Hospital Stay (HOSPITAL_COMMUNITY)
Admission: AD | Admit: 2019-09-17 | Discharge: 2019-09-20 | DRG: 807 | Disposition: A | Discharge status: Home / Self Care | Payer: BC Managed Care – PPO | Attending: Obstetrics and Gynecology | Admitting: Obstetrics and Gynecology

## 2019-09-17 ENCOUNTER — Other Ambulatory Visit: Payer: Self-pay

## 2019-09-17 DIAGNOSIS — Z20822 Contact with and (suspected) exposure to covid-19: Secondary | ICD-10-CM | POA: Diagnosis present

## 2019-09-17 DIAGNOSIS — Z3A38 38 weeks gestation of pregnancy: Secondary | ICD-10-CM

## 2019-09-17 DIAGNOSIS — O4292 Full-term premature rupture of membranes, unspecified as to length of time between rupture and onset of labor: Principal | ICD-10-CM | POA: Diagnosis present

## 2019-09-17 LAB — CBC
HCT: 36 % (ref 36.0–46.0)
Hemoglobin: 11.8 g/dL — ABNORMAL LOW (ref 12.0–15.0)
MCH: 28.7 pg (ref 26.0–34.0)
MCHC: 32.8 g/dL (ref 30.0–36.0)
MCV: 87.6 fL (ref 80.0–100.0)
Platelets: 233 10*3/uL (ref 150–400)
RBC: 4.11 MIL/uL (ref 3.87–5.11)
RDW: 16.9 % — ABNORMAL HIGH (ref 11.5–15.5)
WBC: 9.7 10*3/uL (ref 4.0–10.5)
nRBC: 0 % (ref 0.0–0.2)

## 2019-09-17 LAB — SARS CORONAVIRUS 2 (TAT 6-24 HRS): SARS Coronavirus 2: NEGATIVE

## 2019-09-17 LAB — POCT FERN TEST: POCT Fern Test: POSITIVE

## 2019-09-17 LAB — ABO/RH: ABO/RH(D): O POS

## 2019-09-17 LAB — TYPE AND SCREEN
ABO/RH(D): O POS
Antibody Screen: NEGATIVE

## 2019-09-17 LAB — RPR: RPR Ser Ql: NONREACTIVE

## 2019-09-17 MED ORDER — LACTATED RINGERS IV SOLN: INTRAVENOUS | Status: DC

## 2019-09-17 MED ORDER — LIDOCAINE HCL (PF) 1 % IJ SOLN: 30.0000 mL | INTRAMUSCULAR | Status: DC | PRN

## 2019-09-17 MED ORDER — FENTANYL-BUPIVACAINE-NACL 0.5-0.125-0.9 MG/250ML-% EP SOLN
12.0000 mL/h | EPIDURAL | Status: DC | PRN
  Filled 2019-09-17: qty 250

## 2019-09-17 MED ORDER — OXYTOCIN BOLUS FROM INFUSION
500.0000 mL | Freq: Once | INTRAVENOUS | Status: AC
  Administered 2019-09-18: 500 mL via INTRAVENOUS

## 2019-09-17 MED ORDER — TERBUTALINE SULFATE 1 MG/ML IJ SOLN: 0.2500 mg | Freq: Once | INTRAMUSCULAR | Status: DC | PRN

## 2019-09-17 MED ORDER — SOD CITRATE-CITRIC ACID 500-334 MG/5ML PO SOLN: 30.0000 mL | ORAL | Status: DC | PRN

## 2019-09-17 MED ORDER — FENTANYL CITRATE (PF) 100 MCG/2ML IJ SOLN
50.0000 ug | INTRAMUSCULAR | Status: DC | PRN
  Administered 2019-09-18 (×2): 100 ug via INTRAVENOUS
  Filled 2019-09-17 (×2): qty 2

## 2019-09-17 MED ORDER — OXYCODONE-ACETAMINOPHEN 5-325 MG PO TABS: 2.0000 | ORAL_TABLET | ORAL | Status: DC | PRN

## 2019-09-17 MED ORDER — LACTATED RINGERS IV SOLN
500.0000 mL | INTRAVENOUS | Status: DC | PRN
  Administered 2019-09-18: 250 mL via INTRAVENOUS

## 2019-09-17 MED ORDER — ACETAMINOPHEN 325 MG PO TABS: 650.0000 mg | ORAL_TABLET | ORAL | Status: DC | PRN

## 2019-09-17 MED ORDER — FLEET ENEMA 7-19 GM/118ML RE ENEM: 1.0000 | ENEMA | RECTAL | Status: DC | PRN

## 2019-09-17 MED ORDER — LACTATED RINGERS IV SOLN
500.0000 mL | Freq: Once | INTRAVENOUS | Status: AC
  Administered 2019-09-18: 500 mL via INTRAVENOUS

## 2019-09-17 MED ORDER — OXYTOCIN 40 UNITS IN NORMAL SALINE INFUSION - SIMPLE MED
1.0000 m[IU]/min | INTRAVENOUS | Status: DC
  Administered 2019-09-17: 2 m[IU]/min via INTRAVENOUS
  Filled 2019-09-17: qty 1000

## 2019-09-17 MED ORDER — ONDANSETRON HCL 4 MG/2ML IJ SOLN: 4.0000 mg | Freq: Four times a day (QID) | INTRAMUSCULAR | Status: DC | PRN

## 2019-09-17 MED ORDER — OXYCODONE-ACETAMINOPHEN 5-325 MG PO TABS: 1.0000 | ORAL_TABLET | ORAL | Status: DC | PRN

## 2019-09-17 MED ORDER — EPHEDRINE 5 MG/ML INJ: 10.0000 mg | INTRAVENOUS | Status: DC | PRN

## 2019-09-17 MED ORDER — OXYTOCIN 40 UNITS IN NORMAL SALINE INFUSION - SIMPLE MED: 2.5000 [IU]/h | INTRAVENOUS | Status: DC

## 2019-09-17 MED ORDER — PHENYLEPHRINE 40 MCG/ML (10ML) SYRINGE FOR IV PUSH (FOR BLOOD PRESSURE SUPPORT): 80.0000 ug | PREFILLED_SYRINGE | INTRAVENOUS | Status: DC | PRN

## 2019-09-17 MED ORDER — DIPHENHYDRAMINE HCL 50 MG/ML IJ SOLN: 12.5000 mg | INTRAMUSCULAR | Status: DC | PRN

## 2019-09-17 NOTE — H&P (Signed)
Jennifer Strong is a 29 y.o. female presenting for SROM. PNC uncomplicated. OB History    Gravida  1   Para      Term      Preterm      AB      Living        SAB      TAB      Ectopic      Multiple      Live Births             Past Medical History:  Diagnosis Date  . Bacterial vaginosis    History reviewed. No pertinent surgical history. Family History: family history includes Healthy in her father and mother; Heart attack in her maternal aunt; Hypertension in her maternal aunt and maternal grandfather. Social History:  reports that she has never smoked. She has never used smokeless tobacco. She reports previous alcohol use. She reports that she does not use drugs.     Maternal Diabetes: No Genetic Screening: Normal Maternal Ultrasounds/Referrals: Normal Fetal Ultrasounds or other Referrals:  None Maternal Substance Abuse:  No Significant Maternal Medications:  None Significant Maternal Lab Results:  Group B Strep negative Other Comments:  None  Review of Systems  Eyes: Negative for visual disturbance.  Gastrointestinal: Negative for abdominal pain.  Neurological: Negative for headaches.   Maternal Medical History:  Reason for admission: Rupture of membranes.   Fetal activity: Perceived fetal activity is normal.      Exam by:: Jennifer Strong, CNM Blood pressure 128/87, pulse 88, temperature 98.1 F (36.7 C), temperature source Oral, resp. rate 16, last menstrual period 12/21/2018, SpO2 100 %.   Fetal Exam Fetal State Assessment: Category I - tracings are normal.     Physical Exam  Cardiovascular: Normal rate.  Respiratory: Effort normal.  GI: Soft.    BSUS>vtx per CNM Prenatal labs: ABO, Rh:   Antibody:   Rubella:   RPR:    HBsAg:    HIV:    GBS:   negative 09/01/19  Assessment/Plan: 29 yo G1P0 at term with PPROM Admit   Shon Millet II 09/17/2019, 8:47 AM

## 2019-09-17 NOTE — Progress Notes (Signed)
SROM about 6:25am today Cx 2/50/soft/-2/vtx/well applied AF clear FHT cat one UCs irregular and very mild  A/P: PPROM        D/W need to progress to complete effacement of cervix and then enter active phase of labor. D/W anticipated length of labor and increasing risks of infection with prolonged labor. She states she understands. Will check about noon.

## 2019-09-17 NOTE — MAU Note (Signed)
...  Jennifer Strong is a 29 y.o. at Unknown here in MAU reporting: she woke up at [phone] and used the restroom and felt a gush of fluids that was light pink and clear with no odor. Pt reports she is not in any pain and has felt no CTX. +FM.    Onset of complaint: 09/17/19 at 0625 Pain score: 0 FHT: 130

## 2019-09-17 NOTE — Progress Notes (Signed)
Cx 3/80/-2, Cx was 3/60/-2 @ noon and no change at all in 5-6 hours  IUPC placed about 8:20 and MVU adequate for at least 2 hours  FHT cat one  VSS Afeb  Patient working very hard changing position-hands/knees, peanut, etc  D/W patient lack of progress over several hours despite adequate labor pattern. Recommend epidural for relaxation of pelvic floor and hopefully some descent of vertex. She does not want any medication. Also offered IV medication which she declines. D/W patient that as time goes by without change and prolonging her labor the risk of complications such as chorioamnionitis increase. She states she understands and wants to continue as above.

## 2019-09-17 NOTE — Progress Notes (Signed)
Vtx confirmed by BSUS.  

## 2019-09-18 ENCOUNTER — Encounter (HOSPITAL_COMMUNITY): Payer: Self-pay | Admitting: Obstetrics and Gynecology

## 2019-09-18 ENCOUNTER — Inpatient Hospital Stay (HOSPITAL_COMMUNITY): Payer: BC Managed Care – PPO | Admitting: Anesthesiology

## 2019-09-18 MED ORDER — WITCH HAZEL-GLYCERIN EX PADS: 1.0000 "application " | MEDICATED_PAD | CUTANEOUS | Status: DC | PRN

## 2019-09-18 MED ORDER — ZOLPIDEM TARTRATE 5 MG PO TABS: 5.0000 mg | ORAL_TABLET | Freq: Every evening | ORAL | Status: DC | PRN

## 2019-09-18 MED ORDER — DIBUCAINE (PERIANAL) 1 % EX OINT: 1.0000 "application " | TOPICAL_OINTMENT | CUTANEOUS | Status: DC | PRN

## 2019-09-18 MED ORDER — TETANUS-DIPHTH-ACELL PERTUSSIS 5-2.5-18.5 LF-MCG/0.5 IM SUSP: 0.5000 mL | Freq: Once | INTRAMUSCULAR | Status: DC

## 2019-09-18 MED ORDER — DIPHENHYDRAMINE HCL 25 MG PO CAPS: 25.0000 mg | ORAL_CAPSULE | Freq: Four times a day (QID) | ORAL | Status: DC | PRN

## 2019-09-18 MED ORDER — PRENATAL MULTIVITAMIN CH
1.0000 | ORAL_TABLET | Freq: Every day | ORAL | Status: DC
  Administered 2019-09-20: 1 via ORAL
  Filled 2019-09-18 (×2): qty 1

## 2019-09-18 MED ORDER — BENZOCAINE-MENTHOL 20-0.5 % EX AERO
1.0000 "application " | INHALATION_SPRAY | CUTANEOUS | Status: DC | PRN
  Administered 2019-09-18: 1 via TOPICAL
  Filled 2019-09-18: qty 56

## 2019-09-18 MED ORDER — ONDANSETRON HCL 4 MG/2ML IJ SOLN: 4.0000 mg | INTRAMUSCULAR | Status: DC | PRN

## 2019-09-18 MED ORDER — COCONUT OIL OIL: 1.0000 "application " | TOPICAL_OIL | Status: DC | PRN

## 2019-09-18 MED ORDER — LIDOCAINE HCL (PF) 1 % IJ SOLN
INTRAMUSCULAR | Status: DC | PRN
  Administered 2019-09-18: 5 mL via EPIDURAL

## 2019-09-18 MED ORDER — SODIUM CHLORIDE (PF) 0.9 % IJ SOLN
INTRAMUSCULAR | Status: DC | PRN
  Administered 2019-09-18: 12 mL/h via EPIDURAL

## 2019-09-18 MED ORDER — FLEET ENEMA 7-19 GM/118ML RE ENEM: 1.0000 | ENEMA | Freq: Every day | RECTAL | Status: DC | PRN

## 2019-09-18 MED ORDER — SENNOSIDES-DOCUSATE SODIUM 8.6-50 MG PO TABS
2.0000 | ORAL_TABLET | ORAL | Status: DC
  Administered 2019-09-19 (×2): 2 via ORAL
  Filled 2019-09-18 (×2): qty 2

## 2019-09-18 MED ORDER — ACETAMINOPHEN 325 MG PO TABS: 650.0000 mg | ORAL_TABLET | ORAL | Status: DC | PRN

## 2019-09-18 MED ORDER — IBUPROFEN 600 MG PO TABS
600.0000 mg | ORAL_TABLET | Freq: Four times a day (QID) | ORAL | Status: DC
  Administered 2019-09-19 – 2019-09-20 (×7): 600 mg via ORAL
  Filled 2019-09-18 (×9): qty 1

## 2019-09-18 MED ORDER — MEASLES, MUMPS & RUBELLA VAC IJ SOLR: 0.5000 mL | Freq: Once | INTRAMUSCULAR | Status: DC

## 2019-09-18 MED ORDER — MEDROXYPROGESTERONE ACETATE 150 MG/ML IM SUSP: 150.0000 mg | INTRAMUSCULAR | Status: DC | PRN

## 2019-09-18 MED ORDER — SIMETHICONE 80 MG PO CHEW: 80.0000 mg | CHEWABLE_TABLET | ORAL | Status: DC | PRN

## 2019-09-18 MED ORDER — BISACODYL 10 MG RE SUPP: 10.0000 mg | Freq: Every day | RECTAL | Status: DC | PRN

## 2019-09-18 MED ORDER — ONDANSETRON HCL 4 MG PO TABS: 4.0000 mg | ORAL_TABLET | ORAL | Status: DC | PRN

## 2019-09-18 NOTE — Lactation Note (Signed)
This note was copied from a baby's chart. Lactation Consultation Note  Patient Name: Jennifer Strong [id number] Date: 09/18/2019    G1P1  Telephone call from RN reproting mom has not been seen by lactation.  Baby Jennifer 59 hours old.  No order.  RN put order in.  LC went to see mom and she was sleeping.  RN asked [place] to go back at 8:35pm when she goes.        Maternal Data    Feeding Feeding Type: Breast Fed  [facility] Score                   Interventions    Lactation Tools Discussed/Used     Consult Status      Tonesha Tsou Thompson Caul 09/18/2019, 7:55 PM

## 2019-09-18 NOTE — Lactation Note (Signed)
This note was copied from a Jennifer's chart. Lactation Consultation Note  Patient Name: Jennifer Strong Date: 09/18/2019  Jennifer Strong now 38 hours old born SVD.  Mom is a g1p1.  Mom reports no breastfeeding education but  Did watch a few videos.   Entered room and mom was in bathroom.  Ahmad in crib doing some cuing. Inquired about her breastfeeding goals when she got out of bathroom.  Mom reports she wants to feed him whenever he's hungry on demand.  Reviewed Understanding Mother Jennifer Booklet with mom.  Urged to feed on cue and 8-12 or more times day.  Reviewed Yellow log for keeping track of voids/sttols and feedings.  Mom reports she has a DEBP for home use.  Asked mom if could hand him to her since cuing.  Mom agreed.  Assisted in breastfeeding on right breast in cross cradle hold.  Infant got sleepy, came off, mom tried to burp him and then latched him on the left breast in cross cradle hold.  Mom does not want to hold her breast. Urged her to at least continue to hold it until he is breastfeeding well. Infant fell asleep.  Came off.  Showed mom how to do some hand expression and spoon feeding.  Mom able to hand express small drops of colostrum from each breast and feed via spoon.  Infant got sleepy.Left him STS with mom.  Mom has Breastfeeding Resource for home use.  Urged to call lactation as needed.   Maternal Data    Feeding    LATCH Score                   Interventions    Lactation Tools Discussed/Used     Consult Status      Jennifer Strong 09/18/2019, 9:53 PM

## 2019-09-18 NOTE — Progress Notes (Signed)
Patient is exhausted   [place] units 180-200  Cervix is 80% 5 cm -2  Transverse position  I had an extensive discussion with the patient and her support person Strongly recommend epidural If she does not progress after epidural will proceed with cesarean section Patient agrees to epidural

## 2019-09-18 NOTE — Anesthesia Preprocedure Evaluation (Addendum)
Anesthesia Evaluation  Patient identified by MRN, date of birth, ID band Patient awake    Reviewed: Allergy & Precautions, NPO status , Patient's Chart, lab work & pertinent test results  Airway Mallampati: II  TM Distance: >3 FB Neck ROM: Full    Dental no notable dental hx. (+) Teeth Intact   Pulmonary neg pulmonary ROS,    Pulmonary exam normal breath sounds clear to auscultation       Cardiovascular Exercise Tolerance: Good negative cardio ROS Normal cardiovascular exam Rhythm:Regular Rate:Normal     Neuro/Psych negative neurological ROS     GI/Hepatic negative GI ROS, Neg liver ROS,   Endo/Other  negative endocrine ROS  Renal/GU negative Renal ROS     Musculoskeletal   Abdominal   Peds  Hematology Hgb 11.8 Plt 233   Anesthesia Other Findings   Reproductive/Obstetrics                             Anesthesia Physical Anesthesia Plan  ASA: II  Anesthesia Plan: Epidural   Post-op Pain Management:    Induction:   PONV Risk Score and Plan:   Airway Management Planned:   Additional Equipment:   Intra-op Plan:   Post-operative Plan:   Informed Consent: I have reviewed the patients History and Physical, chart, labs and discussed the procedure including the risks, benefits and alternatives for the proposed anesthesia with the patient or authorized representative who has indicated his/her understanding and acceptance.       Plan Discussed with:   Anesthesia Plan Comments: (38.5 wk G1P0 for LEA)        Anesthesia Quick Evaluation

## 2019-09-18 NOTE — Anesthesia Procedure Notes (Signed)
Epidural Patient location during procedure: OB Start time: 09/18/2019 8:07 AM End time: 09/18/2019 8:22 AM  Staffing Anesthesiologist: Barnet Glasgow, MD Performed: anesthesiologist   Preanesthetic Checklist Completed: patient identified, IV checked, site marked, risks and benefits discussed, surgical consent, monitors and equipment checked, pre-op evaluation and timeout performed  Epidural Patient position: sitting Prep: DuraPrep and site prepped and draped Patient monitoring: continuous pulse ox and blood pressure Approach: midline Location: L3-L4 Injection technique: LOR air  Needle:  Needle type: Tuohy  Needle gauge: 17 G Needle length: 9 cm and 9 Needle insertion depth: 6 cm Catheter type: closed end flexible Catheter size: 19 Gauge Catheter at skin depth: 11 cm Test dose: negative  Assessment Events: blood not aspirated, injection not painful, no injection resistance, no paresthesia and negative IV test  Additional Notes Patient identified. Risks/Benefits/Options discussed with patient including but not limited to bleeding, infection, nerve damage, paralysis, failed block, incomplete pain control, headache, blood pressure changes, nausea, vomiting, reactions to medication both or allergic, itching and postpartum back pain. Confirmed with bedside nurse the patient's most recent platelet count. Confirmed with patient that they are not currently taking any anticoagulation, have any bleeding history or any family history of bleeding disorders. Patient expressed understanding and wished to proceed. All questions were answered. Sterile technique was used throughout the entire procedure. Please see nursing notes for vital signs. Test dose was given through epidural needle and negative prior to continuing to dose epidural or start infusion. Warning signs of high block given to the patient including shortness of breath, tingling/numbness in hands, complete motor block, or any  concerning symptoms with instructions to call for help. Patient was given instructions on fall risk and not to get out of bed. All questions and concerns addressed with instructions to call with any issues.  Attempt (S) . Patient tolerated procedure well.

## 2019-09-19 LAB — CBC
HCT: 32.8 % — ABNORMAL LOW (ref 36.0–46.0)
Hemoglobin: 10.8 g/dL — ABNORMAL LOW (ref 12.0–15.0)
MCH: 28.9 pg (ref 26.0–34.0)
MCHC: 32.9 g/dL (ref 30.0–36.0)
MCV: 87.7 fL (ref 80.0–100.0)
Platelets: 225 10*3/uL (ref 150–400)
RBC: 3.74 MIL/uL — ABNORMAL LOW (ref 3.87–5.11)
RDW: 17.2 % — ABNORMAL HIGH (ref 11.5–15.5)
WBC: 20.4 10*3/uL — ABNORMAL HIGH (ref 4.0–10.5)
nRBC: 0 % (ref 0.0–0.2)

## 2019-09-19 NOTE — Progress Notes (Signed)
PPD # 1  Doing well No complaints.  BP (!) 103/50 (BP Location: Right Arm)   Pulse 65   Temp 99.7 F (37.6 C) (Oral)   Resp 18   Ht 5\' 4"  (1.626 m)   Wt 79.4 kg   LMP 12/21/2018   SpO2 100%   Breastfeeding Unknown   BMI 30.06 kg/m  Results for orders placed or performed during the hospital encounter of 09/17/19 (from the past 24 hour(s))  CBC     Status: Abnormal   Collection Time: 09/19/19  6:10 AM  Result Value Ref Range   WBC 20.4 (H) 4.0 - 10.5 K/uL   RBC 3.74 (L) 3.87 - 5.11 MIL/uL   Hemoglobin 10.8 (L) 12.0 - 15.0 g/dL   HCT 32.8 (L) 36.0 - 46.0 %   MCV 87.7 80.0 - 100.0 fL   MCH 28.9 26.0 - 34.0 pg   MCHC 32.9 30.0 - 36.0 g/dL   RDW 17.2 (H) 11.5 - 15.5 %   Platelets 225 150 - 400 K/uL   nRBC 0.0 0.0 - 0.2 %   Abdomen is soft and non tender  Impression: PPD # 1  Doing well Routine care Circ today  Discharge home tomorrow

## 2019-09-19 NOTE — Anesthesia Postprocedure Evaluation (Signed)
Anesthesia Post Note  Patient: Jennifer Strong  Procedure(s) Performed: AN AD HOC LABOR EPIDURAL     Patient location during evaluation: Mother Baby Anesthesia Type: Epidural Level of consciousness: awake and alert Pain management: pain level controlled Vital Signs Assessment: post-procedure vital signs reviewed and stable Respiratory status: spontaneous breathing Cardiovascular status: stable Postop Assessment: no headache, no backache, epidural receding, patient able to bend at knees, no apparent nausea or vomiting, able to ambulate and adequate PO intake Anesthetic complications: no    Last Vitals:  Vitals:   09/19/19 0010 09/19/19 0405  BP: 109/71 (!) 103/50  Pulse: 94 65  Resp: 18 18  Temp: 37.7 C 37.6 C  SpO2: 100% 100%    Last Pain:  Vitals:   09/19/19 0600  TempSrc:   PainSc: 0-No pain   Pain Goal: Patients Stated Pain Goal: 6 (09/18/19 0612)                 Jabier Mutton

## 2019-09-20 NOTE — Discharge Summary (Signed)
Postpartum Discharge Summary  Date of Service May 23/2021     Patient Name: Jennifer Strong DOB: 1990/05/17 MRN: [id number]  Date of admission: 09/17/2019 Delivery date:09/18/2019  Delivering provider: Dian Queen  Date of discharge: 09/20/2019  Admitting diagnosis: History of preterm premature rupture of membranes (PPROM) [Z87.59] NSVD (normal spontaneous vaginal delivery) [O80] Intrauterine pregnancy: [redacted]w[redacted]d    Secondary diagnosis:  Active Problems:   NSVD (normal spontaneous vaginal delivery)  Additional problems: none    Discharge diagnosis: Term Pregnancy Delivered                                              Post partum procedures:none Augmentation: AROM and Pitocin Complications: None  Hospital course: Induction of Labor With Vaginal Delivery   29y.o. yo G1P1001 at 357w5das admitted to the hospital 09/17/2019 for induction of labor.  Indication for induction: PROM.  Patient had an uncomplicated labor course as follows: Membrane Rupture Time/Date: 6:25 AM ,09/17/2019   Delivery Method:Vaginal, Spontaneous  Episiotomy: None  Lacerations:  1st degree;Perineal  Details of delivery can be found in separate delivery note.  Patient had a routine postpartum course. Patient is discharged home 09/20/19.  Newborn Data: Birth date:09/18/2019  Birth time:1:34 PM  Gender:Female  Living status:Living  Apgars:6 ,9  Weight:3675 g   Magnesium Sulfate received: No BMZ received: No Rhophylac:No MMR:No T-DaP:Given prenatally Flu: No Transfusion:No  Physical exam  Vitals:   09/19/19 0820 09/19/19 1500 09/19/19 2152 09/20/19 0513  BP:  108/72 112/74 111/83  Pulse:  71 68 66  Resp:  _0 Temp: 98.1 F (36.7 C) 97.6 F (36.4 C) 98.8 F (37.1 C) 98.5 F (36.9 C)  TempSrc: Oral Axillary Oral Oral  SpO2:    100%  Weight:      Height:       General: alert, cooperative and no distress Lochia: appropriate Uterine Fundus: firm Incision: Healing well with no  significant drainage DVT Evaluation: No evidence of DVT seen on physical exam. Labs: Lab Results  Component Value Date   WBC 20.4 (H) 09/19/2019   HGB 10.8 (L) 09/19/2019   HCT 32.8 (L) 09/19/2019   MCV 87.7 09/19/2019   PLT 225 09/19/2019   CMP Latest Ref Rng & Units 05/02/2014  Glucose 70 - 99 mg/dL 112(H)  BUN 6 - 23 mg/dL 9  Creatinine 0.50 - 1.10 mg/dL 0.86  Sodium 135 - 145 mmol/L 134(L)  Potassium 3.5 - 5.1 mmol/L 3.3(L)  Chloride 96 - 112 mEq/L 103  CO2 19 - 32 mmol/L 27  Calcium 8.4 - 10.5 mg/dL 9.2  Total Protein 6.0 - 8.3 g/dL 7.4  Total Bilirubin 0.3 - 1.2 mg/dL 0.5  Alkaline Phos 39 - 117 U/L 39  AST 0 - 37 U/L 27  ALT 0 - 35 U/L 11   Edinburgh Score: Edinburgh Postnatal Depression Scale Screening Tool 09/19/2019  I have been able to laugh and see the funny side of things. 0  I have looked forward with enjoyment to things. 0  I have blamed myself unnecessarily when things went wrong. 0  I have been anxious or worried for no good reason. 0  I have felt scared or panicky for no good reason. 0  Things have been getting on top of me. 0  I have been so unhappy that I have had difficulty  sleeping. 0  I have felt sad or miserable. 0  I have been so unhappy that I have been crying. 0  The thought of harming myself has occurred to me. 0  Edinburgh Postnatal Depression Scale Total 0      After visit meds:  Allergies as of 09/20/2019   No Known Allergies     Medication List    STOP taking these medications   calcium carbonate 500 MG chewable tablet Commonly known as: TUMS - dosed in mg elemental calcium   fluconazole 150 MG tablet Commonly known as: Diflucan   metroNIDAZOLE 500 MG tablet Commonly known as: FLAGYL     TAKE these medications   multivitamin-prenatal 27-0.8 MG Tabs tablet Take 1 tablet by mouth daily at 12 noon.        Discharge home in stable condition Infant Feeding: Breast Infant Disposition:home with mother Discharge instruction:  per After Visit Summary and Postpartum booklet. Activity: Advance as tolerated. Pelvic rest for 6 weeks.  Diet: routine diet Anticipated Birth Control: Unsure Postpartum Appointment:6 weeks Additional Postpartum F/U: none Future Appointments:No future appointments. Follow up Visit:      09/20/2019 Cyril Mourning, MD

## 2019-09-20 NOTE — Lactation Note (Signed)
This note was copied from a baby's chart. Lactation Consultation Note  Patient Name: Boy Cadience Danh [id number] Date: 09/20/2019   Infant is 63 hrs old. Mom reports that her breasts feel heavier to her. Infant recently fed & is currently sleeping in bassinet.   A different hand expression technique was taught to Mom which she found to be more effective & more comfortable. I did notice a small compression stripe on her R nipple. Specifics of an asymmetric latch were shown via [employer].   We talked about normal feeding behavior & she understands that infant should feed 8-12 times/24 hrs.  Mom & RN know that I am happy to return if she'd like me to see her again before she & infant leave.  Matthias Hughs [facility] 09/20/2019, 9:02 AM

## 2019-09-20 NOTE — Lactation Note (Signed)
This note was copied from a baby's chart. Lactation Consultation Note  Patient Name: Boy Angelyca Otting [id number] Date: 09/20/2019   Infant is 76 hrs old. Mom called for me to return to room. Mom already had infant latched. Some clicking could be heard, which did not always correlate with swallows. I assisted with flanging the upper lip. Clicking was rarely heard the remainder of my time in the room.   Mom's uterus is cramping with feedings. Mom is comfortable with latch. Infant can be somewhat sleep at breast, but swallows verified by cervical auscultation. I taught Mom how to do breast compression to stimulate infant. Mom felt her breast get softer as the feeding progressed & infant's arm showed increased relaxation.   Mom is now able to identify the soft "cuh" sound of swallows.   Matthias Hughs [facility] 09/20/2019, 12:03 PM

## 2019-09-24 ENCOUNTER — Encounter (HOSPITAL_COMMUNITY): Payer: Self-pay | Admitting: Emergency Medicine

## 2019-09-24 ENCOUNTER — Other Ambulatory Visit: Payer: Self-pay

## 2019-09-24 ENCOUNTER — Emergency Department (HOSPITAL_COMMUNITY)
Admission: EM | Admit: 2019-09-24 | Discharge: 2019-09-25 | Disposition: A | Discharge status: Home / Self Care | Payer: BC Managed Care – PPO | Attending: Emergency Medicine | Admitting: Emergency Medicine

## 2019-09-24 DIAGNOSIS — R001 Bradycardia, unspecified: Secondary | ICD-10-CM | POA: Insufficient documentation

## 2019-09-24 DIAGNOSIS — R0602 Shortness of breath: Secondary | ICD-10-CM | POA: Diagnosis not present

## 2019-09-24 DIAGNOSIS — Z79899 Other long term (current) drug therapy: Secondary | ICD-10-CM | POA: Insufficient documentation

## 2019-09-24 DIAGNOSIS — I10 Essential (primary) hypertension: Secondary | ICD-10-CM | POA: Diagnosis present

## 2019-09-24 DIAGNOSIS — R002 Palpitations: Secondary | ICD-10-CM | POA: Diagnosis not present

## 2019-09-24 LAB — CBC
HCT: 36.2 % (ref 36.0–46.0)
Hemoglobin: 11.6 g/dL — ABNORMAL LOW (ref 12.0–15.0)
MCH: 28.2 pg (ref 26.0–34.0)
MCHC: 32 g/dL (ref 30.0–36.0)
MCV: 87.9 fL (ref 80.0–100.0)
Platelets: 406 10*3/uL — ABNORMAL HIGH (ref 150–400)
RBC: 4.12 MIL/uL (ref 3.87–5.11)
RDW: 16.8 % — ABNORMAL HIGH (ref 11.5–15.5)
WBC: 9.4 10*3/uL (ref 4.0–10.5)
nRBC: 0 % (ref 0.0–0.2)

## 2019-09-24 LAB — BASIC METABOLIC PANEL
Anion gap: 7 (ref 5–15)
BUN: 7 mg/dL (ref 6–20)
CO2: 25 mmol/L (ref 22–32)
Calcium: 8.5 mg/dL — ABNORMAL LOW (ref 8.9–10.3)
Chloride: 105 mmol/L (ref 98–111)
Creatinine, Ser: 0.9 mg/dL (ref 0.44–1.00)
GFR calc Af Amer: >60 mL/min (ref >60)
GFR calc non Af Amer: >60 mL/min (ref >60)
Glucose, Bld: 89 mg/dL (ref 70–99)
Potassium: 4 mmol/L (ref 3.5–5.1)
Sodium: 137 mmol/L (ref 135–145)

## 2019-09-24 NOTE — ED Triage Notes (Addendum)
Patient arrives to ED with complaints of feeling off since Tuesday. Patient states she been taking her blood pressures at home and recently they have been high in the 150's. Patient recently had a child this week on 5/21 which was her first. Patient denies CP, SOB, and abdominal pain.

## 2019-09-25 ENCOUNTER — Other Ambulatory Visit: Payer: Self-pay

## 2019-09-25 ENCOUNTER — Emergency Department (HOSPITAL_COMMUNITY): Payer: BC Managed Care – PPO

## 2019-09-25 DIAGNOSIS — I10 Essential (primary) hypertension: Secondary | ICD-10-CM | POA: Diagnosis not present

## 2019-09-25 NOTE — Discharge Instructions (Signed)
Please go to the [facility] and children's Center at [facility] Saturday morning for a recheck of your blood pressure

## 2019-09-25 NOTE — ED Notes (Signed)
ED Provider at bedside. 

## 2019-09-25 NOTE — ED Notes (Signed)
Patient verbalized understanding of dc instructions, vss, ambulatory with nad.   

## 2019-09-25 NOTE — ED Notes (Signed)
CALLED DR TOMBLIN TO DR Launa Flight

## 2019-09-25 NOTE — ED Provider Notes (Signed)
[facility] EMERGENCY DEPARTMENT Provider Note   CSN: [id number] Arrival date & time: 09/24/19  2227     History Chief Complaint  Patient presents with  . Hypertension    Jennifer Strong is a 29 y.o. female.  The history is provided by the patient.  Hypertension This is a new problem. The current episode started 2 days ago. The problem occurs daily. The problem has not changed since onset.Associated symptoms include shortness of breath. Pertinent negatives include no chest pain, no abdominal pain and no headaches. Nothing aggravates the symptoms. Nothing relieves the symptoms.  Patient presents for concerns for high blood pressure.  Patient reports she is postpartum approximately 1 week.  She reports having an uneventful pregnancy without any significant complications.  No history of hypertension, no history of preeclampsia over the past 2 days she has noted elevated blood pressure and lower heart rate.  No chest pain, but does at times have palpitations and shortness of breath.  No syncope.  No headache or visual changes She is breast-feeding     Past Medical History:  Diagnosis Date  . Bacterial vaginosis     Patient Active Problem List   Diagnosis Date Noted  . NSVD (normal spontaneous vaginal delivery) 09/18/2019  . Palpitations 05/04/2014  . Hypokalemia 05/04/2014    History reviewed. No pertinent surgical history.   OB History    Gravida  1   Para  1   Term  1   Preterm  0   AB  0   Living  1     SAB  0   TAB  0   Ectopic  0   Multiple  0   Live Births  1           Family History  Problem Relation Age of Onset  . Healthy Mother   . Healthy Father   . Heart attack Maternal Aunt   . Hypertension Maternal Aunt   . Hypertension Maternal Grandfather     Social History   Tobacco Use  . Smoking status: Never Smoker  . Smokeless tobacco: Never Used  Substance Use Topics  . Alcohol use: Not Currently    Comment:  occasional  . Drug use: No    Home Medications Prior to Admission medications   Medication Sig Start Date End Date Taking? Authorizing Provider  Prenatal Vit-Fe Fumarate-FA (MULTIVITAMIN-PRENATAL) 27-0.8 MG TABS tablet Take 1 tablet by mouth daily at 12 noon.    [provider]    Allergies    Patient has no known allergies.  Review of Systems   Review of Systems  Constitutional: Negative for fever.  Eyes: Negative for visual disturbance.  Respiratory: Positive for shortness of breath.   Cardiovascular: Positive for palpitations. Negative for chest pain and leg swelling.  Gastrointestinal: Negative for abdominal pain.  Neurological: Negative for syncope, weakness and headaches.  All other systems reviewed and are negative.   Physical Exam Updated Vital Signs BP 129/83 (BP Location: Right Arm)   Pulse (!) 50   Temp 97.9 F (36.6 C) (Oral)   Resp 16   Ht 1.626 m (5\' 4" )   LMP 12/21/2018   SpO2 100%   BMI 30.06 kg/m   Physical Exam CONSTITUTIONAL: Well developed/well nourished HEAD: Normocephalic/atraumatic EYES: EOMI/PERRL ENMT: Mucous membranes moist NECK: supple no meningeal signs, no JVD SPINE/BACK:entire spine nontender CV: S1/S2 noted, no murmurs/rubs/gallops noted LUNGS: Lungs are clear to auscultation bilaterally, no apparent distress ABDOMEN: soft, nontender, no rebound  or guarding, bowel sounds noted throughout abdomen GU:no cva tenderness NEURO: Pt is awake/alert/appropriate, moves all extremitiesx4.  No facial droop.  No clonus.  No patellar hyperreflexia EXTREMITIES: pulses normal/equal, full ROM, no calf tenderness, no lower extremity edema SKIN: warm, color normal PSYCH: no abnormalities of mood noted, alert and oriented to situation  ED Results / Procedures / Treatments   Labs (all labs ordered are listed, but only abnormal results are displayed) Labs Reviewed  CBC - Abnormal; Notable for the following components:      Result Value    Hemoglobin 11.6 (*)    RDW 16.8 (*)    Platelets 406 (*)    All other components within normal limits  BASIC METABOLIC PANEL - Abnormal; Notable for the following components:   Calcium 8.5 (*)    All other components within normal limits    EKG EKG Interpretation  Date/Time:  Thursday Sep 24 2019 22:38:44 EDT Ventricular Rate:  60 PR Interval:  138 QRS Duration: 76 QT Interval:  394 QTC Calculation: 394 R Axis:   86 Text Interpretation: Normal sinus rhythm Normal ECG No significant change since last tracing Confirmed by Ripley Fraise [phone]) on 09/25/2019 6:39:13 AM   Radiology DG Chest Port 1 View  Result Date: 09/25/2019 CLINICAL DATA:  Shortness of breath EXAM: PORTABLE CHEST 1 VIEW COMPARISON:  None FINDINGS: No consolidation, features of edema, pneumothorax, or effusion. Pulmonary vascularity is normally distributed. The cardiomediastinal contours are unremarkable. No acute osseous or soft tissue abnormality. Telemetry leads overlie the chest. IMPRESSION: No acute cardiopulmonary abnormality. Electronically Signed   By: Lovena Le M.D.   On: 09/25/2019 06:27    Procedures .1-3 Lead EKG Interpretation Performed by: Ripley Fraise, MD Authorized by: Ripley Fraise, MD     Interpretation: normal     ECG rate:  50   ECG rate assessment: bradycardic     Rhythm: sinus bradycardia     Ectopy: none     Conduction: normal     Medications Ordered in ED Medications - No data to display  ED Course  I have reviewed the triage vital signs and the nursing notes.  Pertinent labs & imaging results that were available during my care of the patient were reviewed by me and considered in my medical decision making (see chart for details).    MDM Rules/Calculators/A&P                      Patient presents for hypertension and palpitations.  Patient is approximately 1 week postpartum from spontaneous vaginal delivery.  No history of hypertension or preeclampsia.  Patient  does have evidence of hypertension here, but that is improving.  No headache or visual changes to suggest preeclampsia.  She denies any chest pain.  No JVD, no lower extremity edema, no pulmonary edema to suggest postpartum cardiomyopathy.  Patient is overall well-appearing.  Monitoring in the ER reveals sinus bradycardia without acute block. Patient is back to baseline.  BP resolved spontaneously Discussed the case with Dr. Gaetano Net with [facility] We discussed labs, vitals, history.  At This point since BP is improving, patient is safe for discharge.  He request patient be reevaluated in 24-48 hours at the [facility] children [facility] at [facility].  She will need to have a repeat blood pressure check and a repeat evaluation.  This was endorsed to the patient.  She is agreeable with plan.  Also given her number for cardiology as she may  require outpatient monitoring for palpitations.  At this time no arrhythmia or AV block is noted.  Final Clinical Impression(s) / ED Diagnoses Final diagnoses:  Essential hypertension  Palpitations    Rx / DC Orders ED Discharge Orders    None       Ripley Fraise, MD 09/25/19 0700

## 2019-09-26 ENCOUNTER — Inpatient Hospital Stay (HOSPITAL_COMMUNITY)
Admission: AD | Admit: 2019-09-26 | Discharge: 2019-09-26 | Disposition: A | Discharge status: Home / Self Care | Payer: BC Managed Care – PPO | Attending: Obstetrics and Gynecology | Admitting: Obstetrics and Gynecology

## 2019-09-26 ENCOUNTER — Other Ambulatory Visit: Payer: Self-pay

## 2019-09-26 DIAGNOSIS — Z8249 Family history of ischemic heart disease and other diseases of the circulatory system: Secondary | ICD-10-CM | POA: Insufficient documentation

## 2019-09-26 DIAGNOSIS — O165 Unspecified maternal hypertension, complicating the puerperium: Secondary | ICD-10-CM | POA: Insufficient documentation

## 2019-09-26 LAB — COMPREHENSIVE METABOLIC PANEL
ALT: 20 U/L (ref 0–44)
AST: 26 U/L (ref 15–41)
Albumin: 3 g/dL — ABNORMAL LOW (ref 3.5–5.0)
Alkaline Phosphatase: 107 U/L (ref 38–126)
Anion gap: 9 (ref 5–15)
BUN: 7 mg/dL (ref 6–20)
CO2: 23 mmol/L (ref 22–32)
Calcium: 8.8 mg/dL — ABNORMAL LOW (ref 8.9–10.3)
Chloride: 103 mmol/L (ref 98–111)
Creatinine, Ser: 0.83 mg/dL (ref 0.44–1.00)
GFR calc Af Amer: >60 mL/min (ref >60)
GFR calc non Af Amer: >60 mL/min (ref >60)
Glucose, Bld: 86 mg/dL (ref 70–99)
Potassium: 3.9 mmol/L (ref 3.5–5.1)
Sodium: 135 mmol/L (ref 135–145)
Total Bilirubin: 0.6 mg/dL (ref 0.3–1.2)
Total Protein: 6.8 g/dL (ref 6.5–8.1)

## 2019-09-26 LAB — CBC
HCT: 37.9 % (ref 36.0–46.0)
Hemoglobin: 12.2 g/dL (ref 12.0–15.0)
MCH: 28.2 pg (ref 26.0–34.0)
MCHC: 32.2 g/dL (ref 30.0–36.0)
MCV: 87.5 fL (ref 80.0–100.0)
Platelets: 426 10*3/uL — ABNORMAL HIGH (ref 150–400)
RBC: 4.33 MIL/uL (ref 3.87–5.11)
RDW: 16.7 % — ABNORMAL HIGH (ref 11.5–15.5)
WBC: 8.2 10*3/uL (ref 4.0–10.5)
nRBC: 0 % (ref 0.0–0.2)

## 2019-09-26 MED ORDER — ENALAPRIL MALEATE 5 MG PO TABS: 5.0000 mg | ORAL_TABLET | Freq: Every day | ORAL | 0 refills | Status: AC

## 2019-09-26 NOTE — Discharge Instructions (Signed)
Postpartum Hypertension Postpartum hypertension is high blood pressure that remains higher than normal after childbirth. You may not realize that you have postpartum hypertension if your blood pressure is not being checked regularly. In most cases, postpartum hypertension will go away on its own, usually within a week of delivery. However, for some women, medical treatment is required to prevent serious complications, such as seizures or stroke. What are the causes? This condition may be caused by one or more of the following:  Hypertension that existed before pregnancy (chronic hypertension).  Hypertension that comes on as a result of pregnancy (gestational hypertension).  Hypertensive disorders during pregnancy (preeclampsia) or seizures in women who have high blood pressure during pregnancy (eclampsia).  A condition in which the liver, platelets, and red blood cells are damaged during pregnancy (HELLP syndrome).  A condition in which the thyroid produces too much hormones (hyperthyroidism).  Other rare problems of the nerves (neurological disorders) or blood disorders. In some cases, the cause may not be known. What increases the risk? The following factors may make you more likely to develop this condition:  Chronic hypertension. In some cases, this may not have been diagnosed before pregnancy.  Obesity.  Type 2 diabetes.  Kidney disease.  History of preeclampsia or eclampsia.  Other medical conditions that change the level of hormones in the body (hormonal imbalance). What are the signs or symptoms? As with all types of hypertension, postpartum hypertension may not have any symptoms. Depending on how high your blood pressure is, you may experience:  Headaches. These may be mild, moderate, or severe. They may also be steady, constant, or sudden in onset (thunderclap headache).  Changes in your ability to see (visual changes).  Dizziness.  Shortness of breath.  Swelling  of your hands, feet, lower legs, or face. In some cases, you may have swelling in more than one of these locations.  Heart palpitations or a racing heartbeat.  Difficulty breathing while lying down.  Decrease in the amount of urine that you pass. Other rare signs and symptoms may include:  Sweating more than usual. This lasts longer than a few days after delivery.  Chest pain.  Sudden dizziness when you get up from sitting or lying down.  Seizures.  Nausea or vomiting.  Abdominal pain. How is this diagnosed? This condition may be diagnosed based on the results of a physical exam, blood pressure measurements, and blood and urine tests. You may also have other tests, such as a CT scan or an MRI, to check for other problems of postpartum hypertension. How is this treated? If blood pressure is high enough to require treatment, your options may include:  Medicines to reduce blood pressure (antihypertensives). Tell your health care provider if you are breastfeeding or if you plan to breastfeed. There are many antihypertensive medicines that are safe to take while breastfeeding.  Stopping medicines that may be causing hypertension.  Treating medical conditions that are causing hypertension.  Treating the complications of hypertension, such as seizures, stroke, or kidney problems. Your health care provider will also continue to monitor your blood pressure closely until it is within a safe range for you. Follow these instructions at home:  Take over-the-counter and prescription medicines only as told by your health care provider.  Return to your normal activities as told by your health care provider. Ask your health care provider what activities are safe for you.  Do not use any products that contain nicotine or tobacco, such as cigarettes and e-cigarettes. If   you need help quitting, ask your health care provider.  Keep all follow-up visits as told by your health care provider. This  is important. Contact a health care provider if:  Your symptoms get worse.  You have new symptoms, such as: ? A headache that does not get better. ? Dizziness. ? Visual changes. Get help right away if:  You suddenly develop swelling in your hands, ankles, or face.  You have sudden, rapid weight gain.  You develop difficulty breathing, chest pain, racing heartbeat, or heart palpitations.  You develop severe pain in your abdomen.  You have any symptoms of a stroke. "BE FAST" is an easy way to remember the main warning signs of a stroke: ? B - Balance. Signs are dizziness, sudden trouble walking, or loss of balance. ? E - Eyes. Signs are trouble seeing or a sudden change in vision. ? F - Face. Signs are sudden weakness or numbness of the face, or the face or eyelid drooping on one side. ? A - Arms. Signs are weakness or numbness in an arm. This happens suddenly and usually on one side of the body. ? S - Speech. Signs are sudden trouble speaking, slurred speech, or trouble understanding what people say. ? T - Time. Time to call emergency services. Write down what time symptoms started.  You have other signs of a stroke, such as: ? A sudden, severe headache with no known cause. ? Nausea or vomiting. ? Seizure. These symptoms may represent a serious problem that is an emergency. Do not wait to see if the symptoms will go away. Get medical help right away. Call your local emergency services (911 in the [place] Do not drive yourself to the hospital. Summary  Postpartum hypertension is high blood pressure that remains higher than normal after childbirth.  In most cases, postpartum hypertension will go away on its own, usually within a week of delivery.  For some women, medical treatment is required to prevent serious complications, such as seizures or stroke. This information is not intended to replace advice given to you by your health care provider. Make sure you discuss any questions  you have with your health care provider. Document Revised: 05/23/2018 Document Reviewed: 02/04/2017 Elsevier Patient Education  2020 Elsevier Inc.  

## 2019-09-26 NOTE — MAU Note (Signed)
Pt here for evaluation of elevated BP. Reports that at home her BP was 150/93. No issues with high BP during pregnancy. Reports that on yesterday she had reports of "feeling off" and went to ED to be evaluated and that everything was fine and she was sent home. Pt denies HA, vision changes, or RUQ pain.

## 2019-09-26 NOTE — MAU Provider Note (Signed)
Chief Complaint: Blood Pressure Check   First Provider Initiated Contact with Patient 09/26/19 2031     SUBJECTIVE HPI: Jennifer Strong is a 29 y.o. G1P1001 at 8 days postpartum who presents to [facility] reporting hypertension.  She had a vaginal delivery a week ago.  Started having high blood pressure yesterday.  Denies any history of hypertension.  Went to the ED yesterday and had blood pressure monitored and labs done.  ED physician consulted Dr. Gaetano Net who recommended patient to return to MAU this week for blood pressure check and follow-up labs. Patient denies any headache, visual disturbance, or epigastric pain.  Denies any other symptoms.    Past Medical History:  Diagnosis Date  . Bacterial vaginosis    OB History  Gravida Para Term Preterm AB Living  1 1 1  0 0 1  SAB TAB Ectopic Multiple Live Births  0 0 0 0 1    # Outcome Date GA Lbr Len/2nd Weight Sex Delivery Anes PTL Lv  1 Term 09/18/19 [redacted]w[redacted]d 20:39 / 00:55 3675 g M Vag-Spont EPI  LIV   No past surgical history on file. Social History   Socioeconomic History  . Marital status: Single    Spouse name: Not on file  . Number of children: Not on file  . Years of education: Not on file  . Highest education level: Not on file  Occupational History  . Not on file  Tobacco Use  . Smoking status: Never Smoker  . Smokeless tobacco: Never Used  Substance and Sexual Activity  . Alcohol use: Not Currently    Comment: occasional  . Drug use: No  . Sexual activity: Yes    Birth control/protection: Condom, None  Other Topics Concern  . Not on file  Social History Narrative  . Not on file   Social Determinants of Health   Financial Resource Strain:   . Difficulty of Paying Living Expenses:   Food Insecurity:   . Worried About Charity fundraiser in the Last Year:   . Arboriculturist in the Last Year:   Transportation Needs:   . Film/video editor (Medical):   Marland Kitchen Lack of Transportation  (Non-Medical):   Physical Activity:   . Days of Exercise per Week:   . Minutes of Exercise per Session:   Stress:   . Feeling of Stress :   Social Connections:   . Frequency of Communication with Friends and Family:   . Frequency of Social Gatherings with Friends and Family:   . Attends Religious Services:   . Active Member of Clubs or Organizations:   . Attends Archivist Meetings:   Marland Kitchen Marital Status:   Intimate Partner Violence:   . Fear of Current or Ex-Partner:   . Emotionally Abused:   Marland Kitchen Physically Abused:   . Sexually Abused:    Family History  Problem Relation Age of Onset  . Healthy Mother   . Healthy Father   . Heart attack Maternal Aunt   . Hypertension Maternal Aunt   . Hypertension Maternal Grandfather    No current facility-administered medications on file prior to encounter.   Current Outpatient Medications on File Prior to Encounter  Medication Sig Dispense Refill  . Prenatal Vit-Fe Fumarate-FA (MULTIVITAMIN-PRENATAL) 27-0.8 MG TABS tablet Take 1 tablet by mouth daily at 12 noon.     No Known Allergies  I have reviewed patient's Past Medical Hx, Surgical Hx, Family Hx, Social Hx, medications and allergies.  Review of Systems  Constitutional: Negative.   Eyes: Negative for visual disturbance.  Gastrointestinal: Negative.   Genitourinary: Negative.   Neurological: Negative for headaches.    OBJECTIVE Patient Vitals for the past 24 hrs:  BP Temp Temp src Pulse Resp  09/26/19 2215 127/89 -- -- (!) 57 --  09/26/19 2201 (!) 127/91 -- -- (!) 55 --  09/26/19 2146 127/89 -- -- (!) 53 --  09/26/19 2131 127/84 -- -- (!) 52 --  09/26/19 2115 123/86 -- -- (!) 58 --  09/26/19 2100 131/88 -- -- (!) 54 --  09/26/19 2045 124/88 -- -- 60 --  09/26/19 2031 (!) 134/91 -- -- (!) 56 --  09/26/19 2017 (!) 137/91 -- -- 65 --  09/26/19 1954 (!) 138/93 99.1 F (37.3 C) Oral 67 18   Constitutional: Well-developed, well-nourished female in no acute distress.   Cardiovascular: normal rate & rhythm, no murmur Respiratory: normal rate and effort. Lung sounds clear throughout GI: Abd soft, non-tender, Pos BS x 4. No guarding or rebound tenderness MS: Extremities nontender, no edema, normal ROM Neurologic: Alert and oriented x 4.     LAB RESULTS Results for orders placed or performed during the hospital encounter of 09/26/19 (from the past 24 hour(s))  CBC     Status: Abnormal   Collection Time: 09/26/19  8:45 PM  Result Value Ref Range   WBC 8.2 4.0 - 10.5 K/uL   RBC 4.33 3.87 - 5.11 MIL/uL   Hemoglobin 12.2 12.0 - 15.0 g/dL   HCT 37.9 36.0 - 46.0 %   MCV 87.5 80.0 - 100.0 fL   MCH 28.2 26.0 - 34.0 pg   MCHC 32.2 30.0 - 36.0 g/dL   RDW 16.7 (H) 11.5 - 15.5 %   Platelets 426 (H) 150 - 400 K/uL   nRBC 0.0 0.0 - 0.2 %  Comprehensive metabolic panel     Status: Abnormal   Collection Time: 09/26/19  8:45 PM  Result Value Ref Range   Sodium 135 135 - 145 mmol/L   Potassium 3.9 3.5 - 5.1 mmol/L   Chloride 103 98 - 111 mmol/L   CO2 23 22 - 32 mmol/L   Glucose, Bld 86 70 - 99 mg/dL   BUN 7 6 - 20 mg/dL   Creatinine, Ser 0.83 0.44 - 1.00 mg/dL   Calcium 8.8 (L) 8.9 - 10.3 mg/dL   Total Protein 6.8 6.5 - 8.1 g/dL   Albumin 3.0 (L) 3.5 - 5.0 g/dL   AST 26 15 - 41 U/L   ALT 20 0 - 44 U/L   Alkaline Phosphatase 107 38 - 126 U/L   Total Bilirubin 0.6 0.3 - 1.2 mg/dL   GFR calc non Af Amer >60 >60 mL/min   GFR calc Af Amer >60 >60 mL/min   Anion gap 9 5 - 15    IMAGING No results found.  MAU COURSE Orders Placed This Encounter  Procedures  . CBC  . Comprehensive metabolic panel  . Discharge patient   Meds ordered this encounter  Medications  . enalapril (VASOTEC) 5 MG tablet    Sig: Take 1 tablet (5 mg total) by mouth daily.    Dispense:  30 tablet    Refill:  0    Order Specific Question:   Supervising Provider    Answer:   Jonnie Kind [[id number]]    MDM Elevated blood pressure.  None severe range and no severe features.   Labs normal.  Will send home on  5 mg of enalapril.  Will call office for blood pressure check later in the week.  Discussed preeclampsia precautions.  ASSESSMENT 1. Postpartum hypertension     PLAN Discharge home in stable condition. Rx enalapril Left message with [facility] for BP check later in the week Reviewed reasons to return to [facility] Follow up.   Why: the office will call you for BP check appointment next week Contact information: [address] [place] [address] [phone]          Allergies as of 09/26/2019   No Known Allergies     Medication List    TAKE these medications   enalapril 5 MG tablet Commonly known as: VASOTEC Take 1 tablet (5 mg total) by mouth daily.   multivitamin-prenatal 27-0.8 MG Tabs tablet Take 1 tablet by mouth daily at 12 noon.        Jorje Guild, NP 09/26/2019  10:40 PM

## 2019-09-27 ENCOUNTER — Inpatient Hospital Stay (HOSPITAL_COMMUNITY): Admission: RE | Admit: 2019-09-27 | Payer: BC Managed Care – PPO | Source: Home / Self Care

## 2020-09-16 ENCOUNTER — Ambulatory Visit (HOSPITAL_COMMUNITY): Payer: Medicaid Other | Admitting: Clinical

## 2020-10-11 ENCOUNTER — Ambulatory Visit (HOSPITAL_COMMUNITY): Payer: Medicaid Other | Admitting: Clinical

## 2020-12-13 ENCOUNTER — Ambulatory Visit (INDEPENDENT_AMBULATORY_CARE_PROVIDER_SITE_OTHER): Payer: Medicaid Other | Admitting: Clinical

## 2020-12-13 ENCOUNTER — Other Ambulatory Visit: Payer: Self-pay

## 2020-12-13 DIAGNOSIS — F330 Major depressive disorder, recurrent, mild: Secondary | ICD-10-CM | POA: Diagnosis not present

## 2020-12-14 DIAGNOSIS — F330 Major depressive disorder, recurrent, mild: Secondary | ICD-10-CM | POA: Insufficient documentation
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
## 2020-12-15 ENCOUNTER — Ambulatory Visit (HOSPITAL_COMMUNITY)
Admission: EM | Admit: 2020-12-15 | Discharge: 2020-12-15 | Disposition: A | Discharge status: Home / Self Care | Payer: Medicaid Other | Attending: Family Medicine | Admitting: Family Medicine

## 2020-12-15 ENCOUNTER — Encounter (HOSPITAL_COMMUNITY): Payer: Self-pay | Admitting: Emergency Medicine

## 2020-12-15 ENCOUNTER — Other Ambulatory Visit: Payer: Self-pay

## 2020-12-15 DIAGNOSIS — J060 Acute laryngopharyngitis: Secondary | ICD-10-CM | POA: Diagnosis not present

## 2020-12-15 MED ORDER — PREDNISONE 20 MG PO TABS: 40.0000 mg | ORAL_TABLET | Freq: Every day | ORAL | 0 refills | Status: AC

## 2020-12-15 NOTE — ED Triage Notes (Signed)
On Monday started with sore throat, painful to swallow.  Yesterday afternoon started loosing her voice.  Patient reports a cough

## 2020-12-15 NOTE — Discharge Instructions (Addendum)
You may use over the counter ibuprofen or acetaminophen as needed.  °For a sore throat, over the counter products such as [employer] Peroxyl Mouth Sore Rinse or Chloraseptic Sore Throat Spray may provide some temporary relief. ° ° ° ° °

## 2020-12-15 NOTE — ED Provider Notes (Signed)
  [facility]   [id number] 12/15/20 Arrival Time: [id number]  ASSESSMENT & PLAN:  1. Sore throat and laryngitis    Discussed typical duration of viral illnesses. OTC symptom care as needed. Work note provided.  Would like to try trial of: Meds ordered this encounter  Medications   predniSONE (DELTASONE) 20 MG tablet    Sig: Take 2 tablets (40 mg total) by mouth daily.    Dispense:  10 tablet    Refill:  0     Follow-up Information     [facility].   Specialty: Urgent Care Why: As needed. Contact information: [place] [phone]                Reviewed expectations re: course of current medical issues. Questions answered. Outlined signs and symptoms indicating need for more acute intervention. Understanding verbalized. After Visit Summary given.   SUBJECTIVE: History from: patient. Jennifer Strong is a 30 y.o. female who reports ST and laryngitis; beginning 3-4 d ago. Afebrile. Denies: cough and difficulty breathing. Normal PO intake without n/v/d.   OBJECTIVE:  Vitals:   12/15/20 1723  BP: 114/74  Pulse: 81  Resp: 20  Temp: 98.2 F (36.8 C)  TempSrc: Oral  SpO2: 100%    General appearance: alert; no distress Eyes: PERRLA; EOMI; conjunctiva normal HENT: [place]; AT; with mild nasal congestion; throat erythematous with cobblestoning Neck: supple  Lungs: speaks full sentences without difficulty; unlabored Extremities: no edema Skin: warm and dry Neurologic: normal gait Psychological: alert and cooperative; normal mood and affect   No Known Allergies  Past Medical History:  Diagnosis Date   Bacterial vaginosis    Social History   Socioeconomic History   Marital status: Single    Spouse name: Not on file   Number of children: Not on file   Years of education: Not on file   Highest education level: Not on file  Occupational History   Not on file  Tobacco Use    Smoking status: Never   Smokeless tobacco: Never  Vaping Use   Vaping Use: Never used  Substance and Sexual Activity   Alcohol use: Yes    Comment: occasional   Drug use: No   Sexual activity: Yes    Birth control/protection: Condom, None  Other Topics Concern   Not on file  Social History Narrative   Not on file   Social Determinants of Health   Financial Resource Strain: Not on file  Food Insecurity: Not on file  Transportation Needs: Not on file  Physical Activity: Not on file  Stress: Not on file  Social Connections: Not on file  Intimate Partner Violence: Not on file   Family History  Problem Relation Age of Onset   Healthy Mother    Healthy Father    Heart attack Maternal Aunt    Hypertension Maternal Aunt    Hypertension Maternal Grandfather    History reviewed. No pertinent surgical history.   Vanessa Kick, MD 12/15/20 1806

## 2021-02-06 ENCOUNTER — Ambulatory Visit (HOSPITAL_COMMUNITY): Payer: Medicaid Other | Admitting: Clinical

## 2021-02-20 ENCOUNTER — Ambulatory Visit (HOSPITAL_COMMUNITY): Payer: Self-pay | Admitting: Clinical

## 2021-03-20 ENCOUNTER — Ambulatory Visit (HOSPITAL_COMMUNITY): Payer: Medicaid Other | Admitting: Clinical

## 2021-06-05 ENCOUNTER — Other Ambulatory Visit: Payer: Self-pay

## 2021-06-05 ENCOUNTER — Ambulatory Visit (INDEPENDENT_AMBULATORY_CARE_PROVIDER_SITE_OTHER): Payer: Medicaid Other | Admitting: Licensed Clinical Social Worker

## 2021-06-05 DIAGNOSIS — F411 Generalized anxiety disorder: Secondary | ICD-10-CM | POA: Diagnosis not present

## 2021-06-05 NOTE — Progress Notes (Signed)
° °  THERAPIST PROGRESS NOTE  Session Time: 45 min  Participation Level: Active  Behavioral Response: CasualAlertNegative  Type of Therapy:  establish care.  Treatment Goals addressed: Communication: establish care  Interventions: Other: establish care.  Summary: Jennifer Strong is a 31 y.o. female who presents with hx of dep.  Patient had CCA done August 2022.  She states there were several cancellations with her f/u appointments so she wanted to try another clinician.  LCSW did a thorough chart review prior to session.  LCSW reviewed informed consent for counseling with patient's full acknowledgment.  Patient confirms she has had very little counseling in the past.  LCSW assessed for the biggest reason patient is reaching out for help at this time.  Patient shrugs her shoulders and says she does not know and then says she believes everyone needs therapy.  She talks about day-to-day life stressors, the world being "mean, evil" and full of negativity.  Patient states her current stressors are work and significant worry over sending her child out into the world.  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  Patient reports she would like to go back to school for billing and coding and hopes to do so this fall.  She has a goal of obtaining a work from home job.  Patient states "I don't like people".  Patient admits she likes to isolate as much as possible.  She denies tearfulness. She scores 0 on the PHQ-9 and advised that she is not aware she had a past diagnosis of depression.  Patient does endorse some self-esteem related issues.  Patient confirms a strained relationship with the father of her child.  She states he is paying child support but is essentially an absent father who lives about an hour away.  She reports he is white  and she believes he and his family are "racist".  Patient reports a "good" relationship with her older sister she talks to almost daily who lives in [place].  Patient reports "much love" for her grandmother who primarily raised her but states they are not overly close.  Grandmother is also in [place].  She does have communication with her biological mother who lives in [place] but they too are not close.  She confirms mother was incarcerated most of her childhood.  No communication with biological father.  Patient denies being in a current romantic relationship.  When asked about sexual orientation she states "I like who I like".  Patient denies any substance use or history of substance use.  She confirms she is not interested in a medication management evaluation. LCSW reviewed poc including scheduling prior to close of session. Pt states appreciation for care.   Suicidal/Homicidal: Nowithout intent/plan  Therapist Response: Pt somewhat receptive to care.  Plan: Return again for next avail appt.  Diagnosis: Axis I:  [place], LCSW 06/05/2021

## 2021-06-17 ENCOUNTER — Ambulatory Visit (HOSPITAL_COMMUNITY)
Admission: EM | Admit: 2021-06-17 | Discharge: 2021-06-17 | Disposition: A | Discharge status: Home / Self Care | Payer: Medicaid Other | Attending: Emergency Medicine | Admitting: Emergency Medicine

## 2021-06-17 ENCOUNTER — Encounter (HOSPITAL_COMMUNITY): Payer: Self-pay | Admitting: Emergency Medicine

## 2021-06-17 DIAGNOSIS — Z113 Encounter for screening for infections with a predominantly sexual mode of transmission: Secondary | ICD-10-CM

## 2021-06-17 DIAGNOSIS — B3731 Acute candidiasis of vulva and vagina: Secondary | ICD-10-CM

## 2021-06-17 LAB — POCT URINALYSIS DIPSTICK, ED / UC
Bilirubin Urine: NEGATIVE
Glucose, UA: NEGATIVE mg/dL
Hgb urine dipstick: NEGATIVE
Ketones, ur: NEGATIVE mg/dL
Leukocytes,Ua: NEGATIVE
Nitrite: NEGATIVE
Protein, ur: NEGATIVE mg/dL
Specific Gravity, Urine: 1.025 (ref 1.005–1.030)
Urobilinogen, UA: 0.2 mg/dL (ref 0.0–1.0)
pH: 7 (ref 5.0–8.0)

## 2021-06-17 LAB — POC URINE PREG, ED: Preg Test, Ur: NEGATIVE

## 2021-06-17 MED ORDER — FLUCONAZOLE 150 MG PO TABS: 150.0000 mg | ORAL_TABLET | Freq: Once | ORAL | 0 refills | Status: AC

## 2021-06-17 NOTE — ED Triage Notes (Signed)
Reports over past 4 days will having intermittent vaginal itching and white discharge.

## 2021-06-17 NOTE — Discharge Instructions (Addendum)
Vaginal self-swab obtained.  We will follow up with you regarding abnormal results Prescribed diflucan 150 mg once daily and then second dose 72 hours later Take medications as prescribed and to completion If tests results are positive, please abstain from sexual activity until you and your partner(s) have been treated Follow up with PCP or [facility] if symptoms persists Return here or go to ER if you have any new or worsening symptoms fever, chills, nausea, vomiting, abdominal or pelvic pain, painful intercourse, vaginal discharge, vaginal bleeding, persistent symptoms despite treatment, etc... 

## 2021-06-17 NOTE — ED Provider Notes (Signed)
[facility]   [id number] 06/17/21 Arrival Time: 58   Chief Complaint  Patient presents with   Vaginal Discharge     SUBJECTIVE:  Jennifer Strong is a 31 y.o. female presented to the urgent care for complaint of vaginal discharge for the past 4 days.  She denies a precipitating event, recent sexual encounter or recent antibiotic use.  She is sexually active with 1 female partner.  Describes discharge as thick white.  Denies alleviating or aggravating factors.  She reports similar symptoms in the past and was diagnosed with yeast infection and treated accordingly.  She denies fever, chills, nausea, vomiting, abdominal or pelvic pain, urinary symptoms, vaginal bleeding, dyspareunia, vaginal rashes or lesions.   Patient's last menstrual period was 06/03/2021. Current birth control method: Compliant with BC:  ROS: As per HPI.  All other pertinent ROS negative.     Past Medical History:  Diagnosis Date   Bacterial vaginosis    History reviewed. No pertinent surgical history. No Known Allergies No current facility-administered medications on file prior to encounter.   Current Outpatient Medications on File Prior to Encounter  Medication Sig Dispense Refill   Chlorphen-Pseudoephed-APAP (THERAFLU FLU/COLD PO) Take by mouth.     enalapril (VASOTEC) 5 MG tablet Take 1 tablet (5 mg total) by mouth daily. (Patient not taking: Reported on 12/15/2020) 30 tablet 0   predniSONE (DELTASONE) 20 MG tablet Take 2 tablets (40 mg total) by mouth daily. 10 tablet 0   Prenatal Vit-Fe Fumarate-FA (MULTIVITAMIN-PRENATAL) 27-0.8 MG TABS tablet Take 1 tablet by mouth daily at 12 noon. (Patient not taking: Reported on 12/15/2020)      Social History   Socioeconomic History   Marital status: Single    Spouse name: Not on file   Number of children: Not on file   Years of education: Not on file   Highest education level: Not on file  Occupational History   Not on file  Tobacco Use    Smoking status: Never   Smokeless tobacco: Never  Vaping Use   Vaping Use: Never used  Substance and Sexual Activity   Alcohol use: Yes    Comment: occasional   Drug use: No   Sexual activity: Yes    Birth control/protection: Condom, None  Other Topics Concern   Not on file  Social History Narrative   Not on file   Social Determinants of Health   Financial Resource Strain: Not on file  Food Insecurity: Not on file  Transportation Needs: Not on file  Physical Activity: Not on file  Stress: Not on file  Social Connections: Not on file  Intimate Partner Violence: Not on file   Family History  Problem Relation Age of Onset   Healthy Mother    Healthy Father    Heart attack Maternal Aunt    Hypertension Maternal Aunt    Hypertension Maternal Grandfather     OBJECTIVE:  Vitals:   06/17/21 1323  BP: 109/70  Pulse: 78  Resp: 15  Temp: 98.3 F (36.8 C)  TempSrc: Oral  SpO2: 98%     General appearance: Alert, NAD, appears stated age Head: NCAT Throat: lips, mucosa, and tongue normal; teeth and gums normal Lungs: CTA bilaterally without adventitious breath sounds Heart: regular rate and rhythm.  Radial pulses 2+ symmetrical bilaterally Back: no CVA tenderness Abdomen: soft, non-tender; bowel sounds normal; no masses or organomegaly; no guarding or rebound tenderness GU: declines OR External examination without vulvar lesions or erythema Bimanual exam:  Cervical swab obtained Skin: warm and dry Psychological:  Alert and cooperative. Normal mood and affect.  LABS:  Results for orders placed or performed during the hospital encounter of 06/17/21  POC Urinalysis dipstick  Result Value Ref Range   Glucose, UA NEGATIVE NEGATIVE mg/dL   Bilirubin Urine NEGATIVE NEGATIVE   Ketones, ur NEGATIVE NEGATIVE mg/dL   Specific Gravity, Urine 1.025 1.005 - 1.030   Hgb urine dipstick NEGATIVE NEGATIVE   pH 7.0 5.0 - 8.0   Protein, ur NEGATIVE NEGATIVE mg/dL   Urobilinogen, UA  0.2 0.0 - 1.0 mg/dL   Nitrite NEGATIVE NEGATIVE   Leukocytes,Ua NEGATIVE NEGATIVE  POC urine pregnancy  Result Value Ref Range   Preg Test, Ur NEGATIVE NEGATIVE    Labs Reviewed  POCT URINALYSIS DIPSTICK, ED / UC  POC URINE PREG, ED  CERVICOVAGINAL ANCILLARY ONLY    ASSESSMENT & PLAN:  1. Screening examination for STD (sexually transmitted disease)   2. Vaginal yeast infection     Meds ordered this encounter  Medications   fluconazole (DIFLUCAN) 150 MG tablet    Sig: Take 1 tablet (150 mg total) by mouth once for 1 dose. May take second dose 72 hours after the first if symptom does not resolve    Dispense:  2 tablet    Refill:  0    Pending: Labs Reviewed  POCT URINALYSIS DIPSTICK, ED / UC  POC URINE PREG, ED  CERVICOVAGINAL ANCILLARY ONLY    Discharge instructions  Vaginal self-swab obtained.  We will follow up with you regarding abnormal results Prescribed diflucan 150 mg once daily and then second dose 72 hours later Take medications as prescribed and to completion If tests results are positive, please abstain from sexual activity until you and your partner(s) have been treated Follow up with PCP or [facility] if symptoms persists Return here or go to ER if you have any new or worsening symptoms fever, chills, nausea, vomiting, abdominal or pelvic pain, painful intercourse, vaginal discharge, vaginal bleeding, persistent symptoms despite treatment, etc...  Reviewed expectations re: course of current medical issues. Questions answered. Outlined signs and symptoms indicating need for more acute intervention. Patient verbalized understanding. After Visit Summary given.        Emerson Monte, [place] 06/17/21 1345

## 2021-06-19 LAB — CERVICOVAGINAL ANCILLARY ONLY
Bacterial Vaginitis (gardnerella): NEGATIVE
Candida Glabrata: NEGATIVE
Candida Vaginitis: POSITIVE — AB
Chlamydia: NEGATIVE
Comment: NEGATIVE
Comment: NEGATIVE
Comment: NEGATIVE
Comment: NEGATIVE
Comment: NEGATIVE
Comment: NORMAL
Neisseria Gonorrhea: NEGATIVE
Trichomonas: NEGATIVE

## 2021-08-23 ENCOUNTER — Telehealth: Payer: Self-pay

## 2021-08-23 ENCOUNTER — Telehealth (HOSPITAL_COMMUNITY): Payer: Self-pay | Admitting: Licensed Clinical Social Worker

## 2021-08-23 NOTE — Telephone Encounter (Signed)
Jennifer Strong, this is the patient I just spoke with you about. She is upset because she has to keep doing a "meet and greet" with new therapist. She was dropped from [facility]'s case load due to excessive cancellations and no shows. Her therapy services were picked up by Geni Bers, but she is now gone. This patient states that she prefers an [place] American female, but did not work well with the one we have. Patient is requesting to speak with a supervisor because she is upset about the constant change.  ?

## 2021-08-23 NOTE — Telephone Encounter (Signed)
Medication management - Telephone call with patient, after she left a message requesting a call back from the clinic manager for the [facility] outpatient.  Patient was questioning why she was again having to change therapist as her assigned counselor had recently resigned at the [facility].  Patient stated she would prefer to have an African-American female therapist but was not interested in returning to see [employer], who she had seen prior to [employer].  Discussed current interviewing of potential new counselors but also informed patient this request may not be completely accommodated and patient stated understanding but would be interested in seeing a new counselor once hired.  Patient agreed to keep checking back as stated no other concerns at this time.  ?

## 2021-08-28 ENCOUNTER — Ambulatory Visit (HOSPITAL_COMMUNITY): Payer: Medicaid Other | Admitting: Licensed Clinical Social Worker

## 2021-09-18 ENCOUNTER — Ambulatory Visit (HOSPITAL_COMMUNITY): Payer: Medicaid Other | Admitting: Licensed Clinical Social Worker
